# Patient Record
Sex: Female | Born: 1960 | Race: Black or African American | Hispanic: No | Marital: Married | Smoking: Never smoker
Health system: Southern US, Community
[De-identification: ages and names within clinical notes are randomized; demographics above are authoritative.]

## PROBLEM LIST (undated history)

## (undated) HISTORY — PX: DILATION AND CURETTAGE OF UTERUS: SHX78

## (undated) HISTORY — PX: BREAST BIOPSY: SHX20

---

## 1997-11-18 ENCOUNTER — Ambulatory Visit (HOSPITAL_COMMUNITY): Admission: RE | Admit: 1997-11-18 | Discharge: 1997-11-18 | Payer: Self-pay

## 2000-10-16 ENCOUNTER — Other Ambulatory Visit: Admission: RE | Admit: 2000-10-16 | Discharge: 2000-10-16 | Payer: Self-pay | Admitting: Family Medicine

## 2003-12-10 ENCOUNTER — Other Ambulatory Visit: Admission: RE | Admit: 2003-12-10 | Discharge: 2003-12-10 | Payer: Self-pay | Admitting: Family Medicine

## 2005-01-09 ENCOUNTER — Other Ambulatory Visit: Admission: RE | Admit: 2005-01-09 | Discharge: 2005-01-09 | Payer: Self-pay | Admitting: Family Medicine

## 2006-01-11 ENCOUNTER — Other Ambulatory Visit: Admission: RE | Admit: 2006-01-11 | Discharge: 2006-01-11 | Payer: Self-pay | Admitting: Family Medicine

## 2007-01-23 ENCOUNTER — Other Ambulatory Visit: Admission: RE | Admit: 2007-01-23 | Discharge: 2007-01-23 | Payer: Self-pay | Admitting: Family Medicine

## 2008-03-26 ENCOUNTER — Other Ambulatory Visit: Admission: RE | Admit: 2008-03-26 | Discharge: 2008-03-26 | Payer: Self-pay | Admitting: Family Medicine

## 2009-01-02 HISTORY — PX: BREAST BIOPSY: SHX20

## 2014-01-21 LAB — HM COLONOSCOPY

## 2015-01-14 LAB — CBC AND DIFFERENTIAL
HEMATOCRIT: 42 (ref 36–46)
HEMOGLOBIN: 13.4 (ref 12.0–16.0)
Platelets: 224 (ref 150–399)
WBC: 8.4

## 2016-01-27 ENCOUNTER — Encounter (HOSPITAL_COMMUNITY): Payer: Self-pay

## 2016-01-27 ENCOUNTER — Emergency Department (HOSPITAL_COMMUNITY): Payer: BLUE CROSS/BLUE SHIELD

## 2016-01-27 ENCOUNTER — Emergency Department (HOSPITAL_COMMUNITY)
Admission: EM | Admit: 2016-01-27 | Discharge: 2016-01-27 | Disposition: A | Discharge status: Home / Self Care | Payer: BLUE CROSS/BLUE SHIELD | Attending: Emergency Medicine | Admitting: Emergency Medicine

## 2016-01-27 DIAGNOSIS — Z79899 Other long term (current) drug therapy: Secondary | ICD-10-CM | POA: Insufficient documentation

## 2016-01-27 DIAGNOSIS — Z5181 Encounter for therapeutic drug level monitoring: Secondary | ICD-10-CM | POA: Insufficient documentation

## 2016-01-27 DIAGNOSIS — R531 Weakness: Secondary | ICD-10-CM | POA: Diagnosis not present

## 2016-01-27 DIAGNOSIS — J069 Acute upper respiratory infection, unspecified: Secondary | ICD-10-CM | POA: Diagnosis not present

## 2016-01-27 DIAGNOSIS — R930 Abnormal findings on diagnostic imaging of skull and head, not elsewhere classified: Secondary | ICD-10-CM | POA: Insufficient documentation

## 2016-01-27 DIAGNOSIS — R05 Cough: Secondary | ICD-10-CM | POA: Diagnosis present

## 2016-01-27 LAB — COMPREHENSIVE METABOLIC PANEL
ALBUMIN: 4.5 g/dL (ref 3.5–5.0)
ALK PHOS: 83 U/L (ref 38–126)
ALT: 20 U/L (ref 14–54)
ANION GAP: 14 (ref 5–15)
AST: 25 U/L (ref 15–41)
BILIRUBIN TOTAL: 0.5 mg/dL (ref 0.3–1.2)
BUN: 13 mg/dL (ref 6–20)
CALCIUM: 9.9 mg/dL (ref 8.9–10.3)
CO2: 21 mmol/L — ABNORMAL LOW (ref 22–32)
Chloride: 102 mmol/L (ref 101–111)
Creatinine, Ser: 0.92 mg/dL (ref 0.44–1.00)
GFR calc Af Amer: >60 mL/min (ref >60)
GFR calc non Af Amer: >60 mL/min (ref >60)
GLUCOSE: 140 mg/dL — AB (ref 65–99)
Potassium: 3.9 mmol/L (ref 3.5–5.1)
Sodium: 137 mmol/L (ref 135–145)
TOTAL PROTEIN: 7.5 g/dL (ref 6.5–8.1)

## 2016-01-27 LAB — URINALYSIS, ROUTINE W REFLEX MICROSCOPIC
BILIRUBIN URINE: NEGATIVE
GLUCOSE, UA: NEGATIVE mg/dL
HGB URINE DIPSTICK: NEGATIVE
Ketones, ur: >80 mg/dL — AB
Leukocytes, UA: NEGATIVE
Nitrite: NEGATIVE
PH: 6 (ref 5.0–8.0)
Protein, ur: NEGATIVE mg/dL

## 2016-01-27 LAB — I-STAT CHEM 8, ED
BUN: 15 mg/dL (ref 6–20)
CALCIUM ION: 1.11 mmol/L — AB (ref 1.15–1.40)
CHLORIDE: 102 mmol/L (ref 101–111)
CREATININE: 0.8 mg/dL (ref 0.44–1.00)
GLUCOSE: 135 mg/dL — AB (ref 65–99)
HCT: 40 % (ref 36.0–46.0)
Hemoglobin: 13.6 g/dL (ref 12.0–15.0)
Potassium: 3.7 mmol/L (ref 3.5–5.1)
Sodium: 139 mmol/L (ref 135–145)
TCO2: 23 mmol/L (ref 0–100)

## 2016-01-27 LAB — CBC
HCT: 38.2 % (ref 36.0–46.0)
HEMOGLOBIN: 12.6 g/dL (ref 12.0–15.0)
MCH: 27.9 pg (ref 26.0–34.0)
MCHC: 33 g/dL (ref 30.0–36.0)
MCV: 84.5 fL (ref 78.0–100.0)
Platelets: 212 10*3/uL (ref 150–400)
RBC: 4.52 MIL/uL (ref 3.87–5.11)
RDW: 12.2 % (ref 11.5–15.5)
WBC: 6.8 10*3/uL (ref 4.0–10.5)

## 2016-01-27 LAB — I-STAT TROPONIN, ED: Troponin i, poc: 0 ng/mL (ref 0.00–0.08)

## 2016-01-27 LAB — PROTIME-INR
INR: 1.14
Prothrombin Time: 14.7 seconds (ref 11.4–15.2)

## 2016-01-27 LAB — DIFFERENTIAL
Basophils Absolute: 0 10*3/uL (ref 0.0–0.1)
Basophils Relative: 0 %
EOS PCT: 2 %
Eosinophils Absolute: 0.1 10*3/uL (ref 0.0–0.7)
LYMPHS ABS: 2.7 10*3/uL (ref 0.7–4.0)
LYMPHS PCT: 40 %
MONO ABS: 0.6 10*3/uL (ref 0.1–1.0)
Monocytes Relative: 8 %
NEUTROS PCT: 50 %
Neutro Abs: 3.4 10*3/uL (ref 1.7–7.7)

## 2016-01-27 LAB — CBG MONITORING, ED: GLUCOSE-CAPILLARY: 123 mg/dL — AB (ref 65–99)

## 2016-01-27 LAB — APTT: aPTT: 33 seconds (ref 24–36)

## 2016-01-27 NOTE — ED Triage Notes (Signed)
Sig othe with pt concerned for stroke due to episode of disoriented and weakness when he woke.

## 2016-01-27 NOTE — ED Triage Notes (Signed)
[facility] and cough, started on z pak today, had diarrhea after use. Pt reports shaking in legs since waking up at 1150 sts decreased appetite

## 2016-01-27 NOTE — ED Provider Notes (Signed)
[facility]ENCY DEPT Provider Note   CSN: [id number] Arrival date & time: 01/27/16  0026     History   Chief Complaint Chief Complaint  Patient presents with  . Weakness  . Fatigue    HPI Nicole Hawkins is a 56 y.o. female.  Patient presents with complaint of episode of weakness and shakiness that occurred earlier tonight. She has had URI symptoms of cough and congestion for the past 3 weeks that she has been treating with OTC medications including Flonase, Delsym and tylenol. She was seen by Urgent Care yesterday and started a Z-pack. She has been eating and drinking without difficulty. No N, V, D. No fever at any time. No pain. Tonight when she got up from sleep she didn't feel she could stand because of generalized weakness and shakiness in her legs. Symptoms have resolved. No speech difficulties, syncope, chest pain, breathing difficulties.    The history is provided by the patient. No language interpreter was used.  Weakness  Pertinent negatives include no chest pain, no vomiting and no headaches.    History reviewed. No pertinent past medical history.  There are no active problems to display for this patient.   History reviewed. No pertinent surgical history.  OB History    No data available       Home Medications    Prior to Admission medications   Medication Sig Start Date End Date Taking? Authorizing Provider  azithromycin (ZITHROMAX) 250 MG tablet Take 500 mg by mouth daily.   Yes Historical Provider, MD  dextromethorphan (DELSYM) 30 MG/5ML liquid Take 30 mg by mouth as needed for cough.   Yes Historical Provider, MD    Family History History reviewed. No pertinent family history.  Social History Social History  Substance Use Topics  . Smoking status: Not on file  . Smokeless tobacco: Not on file  . Alcohol use Not on file     Allergies   Patient has no known allergies.   Review of Systems Review of Systems  Constitutional: Positive for  fatigue. Negative for chills and fever.  HENT: Positive for congestion.   Respiratory: Positive for cough.   Cardiovascular: Negative.  Negative for chest pain.  Gastrointestinal: Negative for abdominal pain, nausea and vomiting.  Genitourinary: Negative for dysuria.  Musculoskeletal: Positive for myalgias.  Skin: Negative.   Neurological: Positive for weakness. Negative for headaches.     Physical Exam Updated Vital Signs BP 133/77   Pulse 84   Temp 99 F (37.2 C) (Oral)   Resp 12   Wt 53.5 kg   SpO2 100%   Physical Exam  Constitutional: She is oriented to person, place, and time. She appears well-developed and well-nourished.  HENT:  Head: Normocephalic.  Mouth/Throat: Mucous membranes are dry.  Neck: Normal range of motion. Neck supple.  Cardiovascular: Normal rate and regular rhythm.   No murmur heard. Pulmonary/Chest: Effort normal and breath sounds normal. She has no wheezes. She has no rales.  Abdominal: Soft. Bowel sounds are normal. There is no tenderness. There is no rebound and no guarding.  Musculoskeletal: Normal range of motion.  Neurological: She is alert and oriented to person, place, and time. She exhibits normal muscle tone. Coordination normal.  Skin: Skin is warm and dry. No rash noted.  Psychiatric: She has a normal mood and affect.     ED Treatments / Results  Labs (all labs ordered are listed, but only abnormal results are displayed) Labs Reviewed  COMPREHENSIVE METABOLIC PANEL - Abnormal;  Notable for the following:       Result Value   CO2 21 (*)    Glucose, Bld 140 (*)    All other components within normal limits  URINALYSIS, ROUTINE W REFLEX MICROSCOPIC - Abnormal; Notable for the following:    Specific Gravity, Urine >1.030 (*)    Ketones, ur >80 (*)    All other components within normal limits  CBG MONITORING, ED - Abnormal; Notable for the following:    Glucose-Capillary 123 (*)    All other components within normal limits  I-STAT  CHEM 8, ED - Abnormal; Notable for the following:    Glucose, Bld 135 (*)    Calcium, Ion 1.11 (*)    All other components within normal limits  PROTIME-INR  APTT  CBC  DIFFERENTIAL  I-STAT TROPOININ, ED    EKG  EKG Interpretation  Date/Time:  Thursday January 27 2016 00:35:04 EST Ventricular Rate:  101 PR Interval:  170 QRS Duration: 94 QT Interval:  394 QTC Calculation: 510 R Axis:   69 Text Interpretation:  Sinus tachycardia Incomplete right bundle branch block Nonspecific ST abnormality Abnormal ECG No old tracing to compare Confirmed by [facility]  MD, DAVID ([id number]) on 01/27/2016 1:48:44 AM       Radiology Ct Head Wo Contrast  Result Date: 01/27/2016 CLINICAL DATA:  Initial evaluation for acute weakness, sinus disease. EXAM: CT HEAD WITHOUT CONTRAST TECHNIQUE: Contiguous axial images were obtained from the base of the skull through the vertex without intravenous contrast. COMPARISON:  None. FINDINGS: Brain: Cerebral volume normal. Gray-white matter differentiation well maintained. No evidence for acute intracranial hemorrhage. No findings to suggest acute large vessel territory infarct. No mass lesion, midline shift or mass effect. No hydrocephalus. No extra-axial fluid collection. Vascular: No hyperdense vessel. Skull: Scalp soft tissues within normal limits.  Calvarium intact. Sinuses/Orbits: Globes and orbital soft tissues within normal limits. Mild mucosal thickening within the partially visualized right maxillary sinus. Paranasal sinuses are otherwise clear. Trace left mastoid effusion. Right mastoid air cells clear. IMPRESSION: 1. Negative head CT.  No acute intracranial process identified. 2. Mild mucosal thickening within the right maxillary sinus. Otherwise clear sinuses. Electronically Signed   By: Rise Mu M.D.   On: 01/27/2016 01:04    Procedures Procedures (including critical care time)  Medications Ordered in ED Medications - No data to  display   Initial Impression / Assessment and Plan / ED Course  I have reviewed the triage vital signs and the nursing notes.  Pertinent labs & imaging results that were available during my care of the patient were reviewed by me and considered in my medical decision making (see chart for details).     Patient with recent URI symptoms x 3 weeks, today with episode of generalized weakness and shaking in her legs. Husband concerned she might have been disoriented during episode. Head CT negative. Normal neurologic exam. She has been ambulatory without imbalance or weakness. Doubt TIA or stroke. Symptoms more likely related to prolonged respiratory illness. She is felt stable for discharge home. All questions of patient and family answered. No unaddressed concerns.  Final Clinical Impressions(s) / ED Diagnoses   Final diagnoses:  None   1. Generalized weakness 2. URI  New Prescriptions New Prescriptions   No medications on file     Elpidio Anis, Cordelia Poche 01/27/16 0447    Dione Booze, MD 01/27/16 [phone]

## 2016-02-08 LAB — BASIC METABOLIC PANEL
BUN: 12 (ref 4–21)
CREATININE: 0.8 (ref 0.5–1.1)
Glucose: 76

## 2016-02-08 LAB — CBC AND DIFFERENTIAL
HCT: 39 (ref 36–46)
Hemoglobin: 12.6 (ref 12.0–16.0)
PLATELETS: 245 (ref 150–399)
WBC: 6.5

## 2016-02-10 LAB — HEMOGLOBIN A1C: HEMOGLOBIN A1C: 5.7

## 2016-02-10 LAB — BASIC METABOLIC PANEL
BUN: 12 (ref 4–21)
CREATININE: 0.7 (ref 0.5–1.1)
GLUCOSE: 85
POTASSIUM: 4 (ref 3.4–5.3)
Sodium: 138 (ref 137–147)

## 2016-02-10 LAB — HEPATIC FUNCTION PANEL
ALK PHOS: 103 (ref 25–125)
ALT: 24 (ref 7–35)
AST: 22 (ref 13–35)
Bilirubin, Total: 0.4

## 2016-02-10 LAB — LIPID PANEL
CHOLESTEROL: 165 (ref 0–200)
HDL: 41 (ref 35–70)
LDL Cholesterol: 114
Triglycerides: 59 (ref 40–160)

## 2016-02-10 LAB — VITAMIN D 25 HYDROXY (VIT D DEFICIENCY, FRACTURES): VIT D 25 HYDROXY: 40.8

## 2016-02-10 LAB — TSH: TSH: 0.96 (ref 0.41–5.90)

## 2016-03-10 DIAGNOSIS — R040 Epistaxis: Secondary | ICD-10-CM | POA: Diagnosis not present

## 2016-03-17 DIAGNOSIS — R040 Epistaxis: Secondary | ICD-10-CM | POA: Diagnosis not present

## 2016-06-19 DIAGNOSIS — J019 Acute sinusitis, unspecified: Secondary | ICD-10-CM | POA: Diagnosis not present

## 2016-11-17 DIAGNOSIS — Z23 Encounter for immunization: Secondary | ICD-10-CM | POA: Diagnosis not present

## 2017-01-12 DIAGNOSIS — Z1231 Encounter for screening mammogram for malignant neoplasm of breast: Secondary | ICD-10-CM | POA: Diagnosis not present

## 2017-06-26 ENCOUNTER — Ambulatory Visit (INDEPENDENT_AMBULATORY_CARE_PROVIDER_SITE_OTHER): Payer: Self-pay | Admitting: Family Medicine

## 2017-06-26 ENCOUNTER — Encounter: Payer: Self-pay | Admitting: Family Medicine

## 2017-06-26 VITALS — BP 155/76 | HR 80 | Ht 63.0 in | Wt 119.0 lb

## 2017-06-26 DIAGNOSIS — Z8759 Personal history of other complications of pregnancy, childbirth and the puerperium: Secondary | ICD-10-CM

## 2017-06-26 DIAGNOSIS — E559 Vitamin D deficiency, unspecified: Secondary | ICD-10-CM

## 2017-06-26 DIAGNOSIS — Z87898 Personal history of other specified conditions: Secondary | ICD-10-CM | POA: Insufficient documentation

## 2017-06-26 DIAGNOSIS — Z83438 Family history of other disorder of lipoprotein metabolism and other lipidemia: Secondary | ICD-10-CM

## 2017-06-26 DIAGNOSIS — Z803 Family history of malignant neoplasm of breast: Secondary | ICD-10-CM | POA: Insufficient documentation

## 2017-06-26 DIAGNOSIS — Z8249 Family history of ischemic heart disease and other diseases of the circulatory system: Secondary | ICD-10-CM

## 2017-06-26 DIAGNOSIS — Z818 Family history of other mental and behavioral disorders: Secondary | ICD-10-CM

## 2017-06-26 DIAGNOSIS — Z8659 Personal history of other mental and behavioral disorders: Secondary | ICD-10-CM

## 2017-06-26 DIAGNOSIS — Z833 Family history of diabetes mellitus: Secondary | ICD-10-CM

## 2017-06-26 NOTE — Progress Notes (Signed)
New patient office visit note:  Impression and Recommendations:    1. History of prediabetes   2. History of postpartum depression   3. Vitamin D deficiency   4. Family history of breast cancer-maternal grandmother and cousins   5. Family history of diabetes mellitus (DM)-first-degree relative   6. Family history of essential hypertension-in first-degree relative   7. Family history of hyperlipidemia   8. Family history of depression    1. Blood Pressure - Patient's blood pressure is normal and controlled at home.  Pt asymptomatic.  - Reviewed with the patient today that a blood pressure of 119/79 or less is ideal, with BP approaching 140/90 considered hypertension that requires medical attention.  - Encouraged patient to continue ambulatory blood pressure monitoring at home, especially if she feels concerned or symptomatic.  Do not simulate yourself prior with caffeine or emotional outbursts, and sit quietly 15-20 minutes prior to measurement.  - Continue adequate exercise, healthy diet, and monitoring sodium intake.  2. Chronic Sinusitis - Advised the patient to begin using AYR or Neilmed sinus rinses BID followed by flonase BID (one spray to each nostril). Advised that the patient may also incorporate allegra or claritin PRN.   3. BMI Counseling Explained to patient what BMI refers to, and what it means medically.    Told patient to think about it as a "medical risk stratification measurement" and how increasing BMI is associated with increasing risk/ or worsening state of various diseases such as hypertension, hyperlipidemia, diabetes, premature OA, depression etc.  American Heart Association guidelines for healthy diet, basically Mediterranean diet, and exercise guidelines of 30 minutes 5 days per week or more discussed in detail.  Health counseling performed.  All questions answered.  4. Preventative Health Maintenance - Advised patient to continue working toward  exercising to improve health.    - Patient will continue with 35 minutes of activity 5 days per week, walking 2 miles 5 days per week.  Recommended that the patient eventually strive for at least 150 minutes of moderate cardiovascular activity per week according to guidelines established by the [facility].   - Healthy dietary habits encouraged, including low-carb, and high amounts of lean protein in diet.   - Patient should also consume adequate amounts of water - half of body weight in oz of water per day.   Education and routine counseling performed. Handouts provided.  5. Follow-Up - Encouraged patient to return for follow up in 6 months for a complete physical wellness exam, with fasting blood work prior.  Patient would prefer a phone call regarding the results of her lab work.  - Patient due for gynecological exam in 2020.  - Patient knows to sign paperwork today to obtain records from previous doctors.  No orders of the defined types were placed in this encounter.   No orders of the defined types were placed in this encounter.   Gross side effects, risk and benefits, and alternatives of medications discussed with patient.  Patient is aware that all medications have potential side effects and we are unable to predict every side effect or drug-drug interaction that may occur.  Expresses verbal understanding and consents to current therapy plan and treatment regimen.  Return for Follow-up 6 months for complete physical exam, come fasting.  Please see AVS handed out to patient at the end of our visit for further patient instructions/ counseling done pertaining to today's office visit.    Note: This document was prepared  using Conservation officer, historic buildings and may include unintentional dictation errors.     This document serves as a record of services personally performed by Thomasene Lot, DO. It was created on her behalf by Peggye Fothergill, a trained medical scribe. The creation  of this record is based on the scribe's personal observations and the provider's statements to them.   I have reviewed the above medical documentation for accuracy and completeness and I concur.  Thomasene Lot 07/01/17 10:49 PM    ----------------------------------------------------------------------------------------------------------------------    Subjective:    Chief complaint:   Chief Complaint  Patient presents with  . Establish Care   HPI: Nicole Hawkins is a pleasant 57 y.o. female who presents to [facility] today to review their medical history with me and establish care.   I asked the patient to review their chronic problem list with me to ensure everything was updated and accurate.    All recent office visits with other providers, any medical records that patient brought in etc  - I reviewed today.     We asked pt to get [place] their medical records from [facility] providers/ specialists that they had seen within the past 3-5 years- if they are in private practice and/or do not work for [facility], [facility], [facility], [facility] or [facility] owned practice.  Told them to call their specialists to clarify this if they are not sure.   Patient and her husband moved here from [place] in 2017. They were formerly going back and forth, driving 3.5 hours for care right outside of [place].  Social History Patient is originally from around here.  Husband Nicole Bash "Reggie" lost his job several years ago; they moved to [place] near [place] and stayed there for 5 years.  She home schooled her son there.  Returned to this area when her son wanted to move here to go to school, [employer].   She has one child, a son age 61.  No grandcihldren yet.  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  She did attend beauty school and worked in [employer] for maybe six years.  After she got married to her husband, they moved to [place], and she didn't want to go back and get extra hours  - hence becoming a stay at home mom.  EtOH and Alcohol Use Never smoker, doesn't drink alcohol, doesn't do drugs.  Sexually active only with her husband.  Patient enjoys taking the dog for walks. Walks five days a week, 2 miles each walk.  Takes her about 35 minutes per walk. She walks a walking track.  Family History Mom was diagnosed with diabetes in her early 33's. She finally went to the doctor; "she was not an exercise person." May have developed diabetes in her 85's or 53's.  Patient states that her mom was "really overmedicated" and "overwhelmed with medication." Mother died around age 34, 55.  She isn't sure why she died.  Dad had high blood pressure, but was healthy otherwise. "He had some type of heart issue, but he would never expound on that." Passed in 2016 at age 56.  He passed in his sleep. She notes "I think he did have some type of heart issue."  Has six sisters and two brothers. One sister ([place]) has "different types of health issues." Isn't sure what these issues are, but believes these come from "[place] smell and [place]."  One sister has high cholesterol;  notes she also "dealt with a lot of depression and such."  Breast cancer in maternal grandmother; has a cousin with breast cancer.  No first degree relatives with breast cancer.  Surgical History Past Surgical History:  Procedure Laterality Date  . DILATION AND CURETTAGE OF UTERUS      Past Medical History   Patient had lab work performed at her health screen in April 2019.  Patient has never had an abnormal gynecological exam, and does not have an OBGYN. Last gynecological exam was in 2015; she believes she was checked for HPV at that time.  Patient [place] her last [place].  Patient notes that she hates taking any kind of medicine.  - Chronic Sinusitis Sometimes she experiences sinus congestion and pressure with one ear  getting clogged up.  She sleeps on that side of her face.  She uses AYR sinus rinses before going to sleep at night.  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  - Blood Pressure Patient's last health screen was performed in April 2019. Blood pressure at that time was in the 160's over 80's.  Notes that since her BP was "all over the place" at her screen, she started keeping it at home.  Notes "a few times it was really low, a few times it was really high."  She tried to take it at morning and at night before she went to bed.  At home, BP over past several months: 120's, 130's over 60's to 70's. Lowest measurements were 101/65, 109/60.   Wt Readings from Last 3 Encounters:  06/26/17 119 lb (54 kg)  01/27/16 118 lb (53.5 kg)   BP Readings from Last 3 Encounters:  06/26/17 (!) 155/76  01/27/16 140/77   Pulse Readings from Last 3 Encounters:  06/26/17 80  01/27/16 84   BMI Readings from Last 3 Encounters:  06/26/17 21.08 kg/m    Patient Care Team    Relationship Specialty Notifications Start End  Thomasene Lot, DO PCP - General Family Medicine  06/26/17     Patient Active Problem List   Diagnosis Date Noted  . Vitamin D deficiency 06/26/2017  . Family history of diabetes mellitus (DM)-first-degree relative 06/26/2017  . Family history of breast cancer 06/26/2017  . Family history of essential hypertension-in first-degree relative 06/26/2017  . Family history of hyperlipidemia 06/26/2017  . History of postpartum depression 06/26/2017  . History of prediabetes 06/26/2017  . Family history of depression 06/26/2017  . Epistaxis, recurrent 03/17/2016     History reviewed. No pertinent past medical history.   History reviewed. No pertinent past medical history.   Past Surgical History:  Procedure Laterality Date  . DILATION AND  CURETTAGE OF UTERUS       Family History  Problem Relation Age of Onset  . Diabetes Mother   . Hypertension Mother   . Stroke Mother   . Hypertension Father   . High Cholesterol Sister      Social History   Substance and Sexual Activity  Drug Use Never     Social History   Substance and Sexual Activity  Alcohol Use Never  . Frequency: Never     Social History   Tobacco Use  Smoking Status Never Smoker  Smokeless Tobacco Never Used     Current Meds  Medication Sig  . Cholecalciferol (VITAMIN D3) 1000 units CAPS Take 1 capsule by mouth daily.    Allergies: Patient has no known allergies.   Review of Systems  Constitutional: Negative for chills, diaphoresis, fever, malaise/fatigue and weight loss.  HENT: Negative for congestion, sore throat and tinnitus.   Eyes: Negative for blurred vision, double vision and photophobia.  Respiratory: Negative for cough and wheezing.   Cardiovascular: Negative for chest pain and palpitations.  Gastrointestinal: Negative for blood in stool, diarrhea, nausea and vomiting.  Genitourinary: Negative for dysuria, frequency and urgency.  Musculoskeletal: Negative for joint pain and myalgias.  Skin: Negative for itching and rash.  Neurological: Negative for dizziness, focal weakness, weakness and headaches.  Endo/Heme/Allergies: Negative for environmental allergies and polydipsia. Does not bruise/bleed easily.  Psychiatric/Behavioral: Negative for depression and memory loss. The patient is not nervous/anxious and does not have insomnia.      Objective:   Blood pressure (!) 155/76, pulse 80, height 5\' 3"  (1.6 m), weight 119 lb (54 kg), SpO2 98 %. Body mass index is 21.08 kg/m. General: Well Developed, well nourished, and in no acute distress.  Neuro: Alert and oriented x3, extra-ocular muscles intact, sensation grossly intact.  HEENT: TM bulging slightly more on left TM rather than the right.  Intact light reflex with no  effusion, otherwise normal. Gravity/AT, PERRLA, neck supple, No carotid bruits Skin: no gross rashes  Cardiac: Regular rate and rhythm Respiratory: Essentially clear to auscultation bilaterally. Not using accessory muscles, speaking in full sentences.  Abdominal: not grossly distended Musculoskeletal: Ambulates w/o diff, FROM * 4 ext.  Vasc: less 2 sec cap RF, warm and pink  Psych:  No HI/SI, judgement and insight good, Euthymic mood. Full Affect.    No results found for this or any previous visit (from the past 2160 hour(s)).

## 2017-06-26 NOTE — Patient Instructions (Signed)
Please continue to monitor your blood pressure at home with the arm blood pressure cuff.  It appears you might have whitecoat hypertension or blood pressure the gets elevated when you go to the doctor's office.  Blood pressures at home have been well controlled and please continue to monitor and bring in a log each time you come see me.  Checking it at least 2-3 times per week is appropriate.   Please realize, EXERCISE IS MEDICINE!  -  American Heart Association [facility]) guidelines for exercise : If you are in good health, without any medical conditions, you should engage in 150 minutes of moderate intensity aerobic activity per week.  This means you should be huffing and puffing throughout your workout.   Engaging in regular exercise will improve brain function and memory, as well as improve mood, boost immune system and help with weight management.  As well as the other, more well-known effects of exercise such as decreasing blood sugar levels, decreasing blood pressure,  and decreasing bad cholesterol levels/ increasing good cholesterol levels.     -  The AHA strongly endorses consumption of a diet that contains a variety of foods from all the food categories with an emphasis on fruits and vegetables; fat-free and low-fat dairy products; cereal and grain products; legumes and nuts; and fish, poultry, and/or extra lean meats.    Excessive food intake, especially of foods high in saturated and trans fats, sugar, and salt, should be avoided.    Adequate water intake of roughly 1/2 of your weight in pounds, should equal the ounces of water per day you should drink.  So for instance, if you're 200 pounds, that would be 100 ounces of water per day.         Mediterranean Diet  Why follow it? Research shows. . Those who follow the Mediterranean diet have a reduced risk of heart disease  . The diet is associated with a reduced incidence of Parkinson's and Alzheimer's diseases . People following the diet  may have longer life expectancies and lower rates of chronic diseases  . The Dietary Guidelines for Americans recommends the Mediterranean diet as an eating plan to promote health and prevent disease  What Is the Mediterranean Diet?  . Healthy eating plan based on typical foods and recipes of Mediterranean-style cooking . The diet is primarily a plant based diet; these foods should make up a majority of meals   Starches - Plant based foods should make up a majority of meals - They are an important sources of vitamins, minerals, energy, antioxidants, and fiber - Choose whole grains, foods high in fiber and minimally processed items  - Typical grain sources include wheat, oats, barley, corn, brown rice, bulgar, farro, millet, polenta, couscous  - Various types of beans include chickpeas, lentils, fava beans, black beans, white beans   Fruits  Veggies - Large quantities of antioxidant rich fruits & veggies; 6 or more servings  - Vegetables can be eaten raw or lightly drizzled with oil and cooked  - Vegetables common to the traditional Mediterranean Diet include: artichokes, arugula, beets, broccoli, brussel sprouts, cabbage, carrots, celery, collard greens, cucumbers, eggplant, kale, leeks, lemons, lettuce, mushrooms, okra, onions, peas, peppers, potatoes, pumpkin, radishes, rutabaga, shallots, spinach, sweet potatoes, turnips, zucchini - Fruits common to the Mediterranean Diet include: apples, apricots, avocados, cherries, clementines, dates, figs, grapefruits, grapes, melons, nectarines, oranges, peaches, pears, pomegranates, strawberries, tangerines  Fats - Replace butter and margarine with healthy oils, such as olive oil,  canola oil, and tahini  - Limit nuts to no more than a handful a day  - Nuts include walnuts, almonds, pecans, pistachios, pine nuts  - Limit or avoid candied, honey roasted or heavily salted nuts - Olives are central to the Mediterranean diet - can be eaten whole or used in a  variety of dishes   Meats Protein - Limiting red meat: no more than a few times a month - When eating red meat: choose lean cuts and keep the portion to the size of deck of cards - Eggs: approx. 0 to 4 times a week  - Fish and lean poultry: at least 2 a week  - Healthy protein sources include, chicken, [place], lean beef, lamb - Increase intake of seafood such as tuna, salmon, trout, mackerel, shrimp, scallops - Avoid or limit high fat processed meats such as sausage and bacon  Dairy - Include moderate amounts of low fat dairy products  - Focus on healthy dairy such as fat free yogurt, skim milk, low or reduced fat cheese - Limit dairy products higher in fat such as whole or 2% milk, cheese, ice cream  Alcohol - Moderate amounts of red wine is ok  - No more than 5 oz daily for women (all ages) and men older than age 58  - No more than 10 oz of wine daily for men younger than 7665  Other - Limit sweets and other desserts  - Use herbs and spices instead of salt to flavor foods  - Herbs and spices common to the traditional Mediterranean Diet include: basil, bay leaves, chives, cloves, cumin, fennel, garlic, lavender, marjoram, mint, oregano, parsley, pepper, rosemary, sage, savory, sumac, tarragon, thyme   It's not just a diet, it's a lifestyle:  . The Mediterranean diet includes lifestyle factors typical of those in the region  . Foods, drinks and meals are best eaten with others and savored . Daily physical activity is important for overall good health . This could be strenuous exercise like running and aerobics . This could also be more leisurely activities such as walking, housework, yard-work, or taking the stairs . Moderation is the key; a balanced and healthy diet accommodates most foods and drinks . Consider portion sizes and frequency of consumption of certain foods   Meal Ideas & Options:  . Breakfast:  o Whole wheat toast or whole wheat English muffins with peanut butter & hard  boiled egg o Steel cut oats topped with apples & cinnamon and skim milk  o Fresh fruit: banana, strawberries, melon, berries, peaches  o Smoothies: strawberries, bananas, greek yogurt, peanut butter o Low fat greek yogurt with blueberries and granola  o Egg white omelet with spinach and mushrooms o Breakfast couscous: whole wheat couscous, apricots, skim milk, cranberries  . Sandwiches:  o Hummus and grilled vegetables (peppers, zucchini, squash) on whole wheat bread   o Grilled chicken on whole wheat pita with lettuce, tomatoes, cucumbers or tzatziki  o Tuna salad on whole wheat bread: tuna salad made with greek yogurt, olives, red peppers, capers, green onions o Garlic rosemary lamb pita: lamb sauted with garlic, rosemary, salt & pepper; add lettuce, cucumber, greek yogurt to pita - flavor with lemon juice and black pepper  . Seafood:  o Mediterranean grilled salmon, seasoned with garlic, basil, parsley, lemon juice and black pepper o Shrimp, lemon, and spinach whole-grain pasta salad made with low fat greek yogurt  o Seared scallops with lemon orzo  o Seared tuna steaks seasoned  salt, pepper, coriander topped with tomato mixture of olives, tomatoes, olive oil, minced garlic, parsley, green onions and cappers  . Meats:  o Herbed greek chicken salad with kalamata olives, cucumber, feta  o Red bell peppers stuffed with spinach, bulgur, lean ground beef (or lentils) & topped with feta   o Kebabs: skewers of chicken, tomatoes, onions, zucchini, squash  o [place] burgers: made with red onions, mint, dill, lemon juice, feta cheese topped with roasted red peppers . Vegetarian o Cucumber salad: cucumbers, artichoke hearts, celery, red onion, feta cheese, tossed in olive oil & lemon juice  o Hummus and whole grain pita points with a greek salad (lettuce, tomato, feta, olives, cucumbers, red onion) o Lentil soup with celery, carrots made with vegetable broth, garlic, salt and pepper  o Tabouli  salad: parsley, bulgur, mint, scallions, cucumbers, tomato, radishes, lemon juice, olive oil, salt and pepper.

## 2017-10-26 ENCOUNTER — Ambulatory Visit (INDEPENDENT_AMBULATORY_CARE_PROVIDER_SITE_OTHER): Payer: PRIVATE HEALTH INSURANCE

## 2017-10-26 VITALS — BP 138/77 | HR 69

## 2017-10-26 DIAGNOSIS — Z23 Encounter for immunization: Secondary | ICD-10-CM | POA: Diagnosis not present

## 2017-10-26 NOTE — Progress Notes (Signed)
Pt here for influenza vaccine.  Screening questionnaire reviewed, VIS provided to patient, and any/all patient questions answered.  T. Nelson, CMA  

## 2017-12-17 ENCOUNTER — Ambulatory Visit (INDEPENDENT_AMBULATORY_CARE_PROVIDER_SITE_OTHER): Payer: PRIVATE HEALTH INSURANCE | Admitting: Family Medicine

## 2017-12-17 ENCOUNTER — Encounter: Payer: Self-pay | Admitting: Family Medicine

## 2017-12-17 ENCOUNTER — Other Ambulatory Visit (HOSPITAL_COMMUNITY)
Admission: RE | Admit: 2017-12-17 | Discharge: 2017-12-17 | Disposition: A | Discharge status: Home / Self Care | Payer: PRIVATE HEALTH INSURANCE | Source: Ambulatory Visit | Attending: Family Medicine | Admitting: Family Medicine

## 2017-12-17 VITALS — BP 130/86 | HR 80 | Temp 97.6°F | Ht 63.0 in | Wt 120.5 lb

## 2017-12-17 DIAGNOSIS — Z87898 Personal history of other specified conditions: Secondary | ICD-10-CM

## 2017-12-17 DIAGNOSIS — Z124 Encounter for screening for malignant neoplasm of cervix: Secondary | ICD-10-CM | POA: Diagnosis present

## 2017-12-17 DIAGNOSIS — Z0000 Encounter for general adult medical examination without abnormal findings: Secondary | ICD-10-CM

## 2017-12-17 DIAGNOSIS — Z1239 Encounter for other screening for malignant neoplasm of breast: Secondary | ICD-10-CM

## 2017-12-17 DIAGNOSIS — E559 Vitamin D deficiency, unspecified: Secondary | ICD-10-CM

## 2017-12-17 DIAGNOSIS — Z0100 Encounter for examination of eyes and vision without abnormal findings: Secondary | ICD-10-CM | POA: Diagnosis not present

## 2017-12-17 DIAGNOSIS — Z1211 Encounter for screening for malignant neoplasm of colon: Secondary | ICD-10-CM

## 2017-12-17 DIAGNOSIS — Z23 Encounter for immunization: Secondary | ICD-10-CM | POA: Diagnosis not present

## 2017-12-17 DIAGNOSIS — Z113 Encounter for screening for infections with a predominantly sexual mode of transmission: Secondary | ICD-10-CM

## 2017-12-17 DIAGNOSIS — Z803 Family history of malignant neoplasm of breast: Secondary | ICD-10-CM

## 2017-12-17 DIAGNOSIS — Z719 Counseling, unspecified: Secondary | ICD-10-CM | POA: Insufficient documentation

## 2017-12-17 LAB — POC HEMOCCULT BLD/STL (OFFICE/1-CARD/DIAGNOSTIC): Fecal Occult Blood, POC: NEGATIVE

## 2017-12-17 MED ORDER — TETANUS-DIPHTH-ACELL PERTUSSIS 5-2.5-18.5 LF-MCG/0.5 IM SUSP
0.5000 mL | Freq: Once | INTRAMUSCULAR | Status: AC
  Administered 2017-12-17: 0.5 mL via INTRAMUSCULAR

## 2017-12-17 NOTE — Patient Instructions (Addendum)
Pt needs a photo please!   Patient prefers to come in in near future to review labs in person.  Please make appointment  -Please let us know what your insurance says about the Shingrix vaccine    Preventive Care for Adults, Female  A healthy lifestyle and preventive care can promote health and wellness. Preventive health guidelines for women include the following key practices.   A routine yearly physical is a good way to check with your health care provider about your health and preventive screening. It is a chance to share any concerns and updates on your health and to receive a thorough exam.   Visit your dentist for a routine exam and preventive care every 6 months. Brush your teeth twice a day and floss once a day. Good oral hygiene prevents tooth decay and gum disease.   The frequency of eye exams is based on your age, health, family medical history, use of contact lenses, and other factors. Follow your health care provider's recommendations for frequency of eye exams.   Eat a healthy diet. Foods like vegetables, fruits, whole grains, low-fat dairy products, and lean protein foods contain the nutrients you need without too many calories. Decrease your intake of foods high in solid fats, added sugars, and salt. Eat the right amount of calories for you.Get information about a proper diet from your health care provider, if necessary.   Regular physical exercise is one of the most important things you can do for your health. Most adults should get at least 150 minutes of moderate-intensity exercise (any activity that increases your heart rate and causes you to sweat) each week. In addition, most adults need muscle-strengthening exercises on 2 or more days a week.   Maintain a healthy weight. The body mass index (BMI) is a screening tool to identify possible weight problems. It provides an estimate of body fat based on height and weight. Your health care provider can find your BMI, and  can help you achieve or maintain a healthy weight.For adults 20 years and older:   - A BMI below 18.5 is considered underweight.   - A BMI of 18.5 to 24.9 is normal.   - A BMI of 25 to 29.9 is considered overweight.   - A BMI of 30 and above is considered obese.   Maintain normal blood lipids and cholesterol levels by exercising and minimizing your intake of trans and saturated fats.  Eat a balanced diet with plenty of fruit and vegetables. Blood tests for lipids and cholesterol should begin at age 65 and be repeated every 5 years minimum.  If your lipid or cholesterol levels are high, you are over 40, or you are at high risk for heart disease, you may need your cholesterol levels checked more frequently.Ongoing high lipid and cholesterol levels should be treated with medicines if diet and exercise are not working.   If you smoke, find out from your health care provider how to quit. If you do not use tobacco, do not start.   Lung cancer screening is recommended for adults aged 66-80 years who are at high risk for developing lung cancer because of a history of smoking. A yearly low-dose CT scan of the lungs is recommended for people who have at least a 30-pack-year history of smoking and are a current smoker or have quit within the past 15 years. A pack year of smoking is smoking an average of 1 pack of cigarettes a day for 1 year (for example:  1 pack a day for 30 years or 2 packs a day for 15 years). Yearly screening should continue until the smoker has stopped smoking for at least 15 years. Yearly screening should be stopped for people who develop a health problem that would prevent them from having lung cancer treatment.   If you are pregnant, do not drink alcohol. If you are breastfeeding, be very cautious about drinking alcohol. If you are not pregnant and choose to drink alcohol, do not have more than 1 drink per day. One drink is considered to be 12 ounces (355 mL) of beer, 5 ounces (148 mL)  of wine, or 1.5 ounces (44 mL) of liquor.   Avoid use of street drugs. Do not share needles with anyone. Ask for help if you need support or instructions about stopping the use of drugs.   High blood pressure causes heart disease and increases the risk of stroke. Your blood pressure should be checked at least yearly.  Ongoing high blood pressure should be treated with medicines if weight loss and exercise do not work.   If you are 66-21 years old, ask your health care provider if you should take aspirin to prevent strokes.   Diabetes screening involves taking a blood sample to check your fasting blood sugar level. This should be done once every 3 years, after age 40, if you are within normal weight and without risk factors for diabetes. Testing should be considered at a younger age or be carried out more frequently if you are overweight and have at least 1 risk factor for diabetes.   Breast cancer screening is essential preventive care for women. You should practice "breast self-awareness."  This means understanding the normal appearance and feel of your breasts and may include breast self-examination.  Any changes detected, no matter how small, should be reported to a health care provider.  Women in their 48s and 30s should have a clinical breast exam (CBE) by a health care provider as part of a regular health exam every 1 to 3 years.  After age 15, women should have a CBE every year.  Starting at age 43, women should consider having a mammogram (breast X-ray test) every year.  Women who have a family history of breast cancer should talk to their health care provider about genetic screening.  Women at a high risk of breast cancer should talk to their health care providers about having an MRI and a mammogram every year.   -Breast cancer gene (BRCA)-related cancer risk assessment is recommended for women who have family members with BRCA-related cancers. BRCA-related cancers include breast, ovarian,  tubal, and peritoneal cancers. Having family members with these cancers may be associated with an increased risk for harmful changes (mutations) in the breast cancer genes BRCA1 and BRCA2. Results of the assessment will determine the need for genetic counseling and BRCA1 and BRCA2 testing.   The Pap test is a screening test for cervical cancer. A Pap test can show cell changes on the cervix that might become cervical cancer if left untreated. A Pap test is a procedure in which cells are obtained and examined from the lower end of the uterus (cervix).   - Women should have a Pap test starting at age 79.   - Between ages 5 and 36, Pap tests should be repeated every 2 years.   - Beginning at age 75, you should have a Pap test every 3 years as long as the past 3 Pap tests have  been normal.   - Some women have medical problems that increase the chance of getting cervical cancer. Talk to your health care provider about these problems. It is especially important to talk to your health care provider if a new problem develops soon after your last Pap test. In these cases, your health care provider may recommend more frequent screening and Pap tests.   - The above recommendations are the same for women who have or have not gotten the vaccine for human papillomavirus (HPV).   - If you had a hysterectomy for a problem that was not cancer or a condition that could lead to cancer, then you no longer need Pap tests. Even if you no longer need a Pap test, a regular exam is a good idea to make sure no other problems are starting.   - If you are between ages 81 and 57 years, and you have had normal Pap tests going back 10 years, you no longer need Pap tests. Even if you no longer need a Pap test, a regular exam is a good idea to make sure no other problems are starting.   - If you have had past treatment for cervical cancer or a condition that could lead to cancer, you need Pap tests and screening for cancer for  at least 20 years after your treatment.   - If Pap tests have been discontinued, risk factors (such as a new sexual partner) need to be reassessed to determine if screening should be resumed.   - The HPV test is an additional test that may be used for cervical cancer screening. The HPV test looks for the virus that can cause the cell changes on the cervix. The cells collected during the Pap test can be tested for HPV. The HPV test could be used to screen women aged 85 years and older, and should be used in women of any age who have unclear Pap test results. After the age of 23, women should have HPV testing at the same frequency as a Pap test.   Colorectal cancer can be detected and often prevented. Most routine colorectal cancer screening begins at the age of 23 years and continues through age 14 years. However, your health care provider may recommend screening at an earlier age if you have risk factors for colon cancer. On a yearly basis, your health care provider may provide home test kits to check for hidden blood in the stool.  Use of a small camera at the end of a tube, to directly examine the colon (sigmoidoscopy or colonoscopy), can detect the earliest forms of colorectal cancer. Talk to your health care provider about this at age 89, when routine screening begins. Direct exam of the colon should be repeated every 5 -10 years through age 109 years, unless early forms of pre-cancerous polyps or small growths are found.   People who are at an increased risk for hepatitis B should be screened for this virus. You are considered at high risk for hepatitis B if:  -You were born in a country where hepatitis B occurs often. Talk with your health care provider about which countries are considered high risk.  - Your parents were born in a high-risk country and you have not received a shot to protect against hepatitis B (hepatitis B vaccine).  - You have HIV or AIDS.  - You use needles to inject street  drugs.  - You live with, or have sex with, someone who has Hepatitis B.  -  You get hemodialysis treatment.  - You take certain medicines for conditions like cancer, organ transplantation, and autoimmune conditions.   Hepatitis C blood testing is recommended for all people born from [birth year] through 1966 and any individual with known risks for hepatitis C.   Practice safe sex. Use condoms and avoid high-risk sexual practices to reduce the spread of sexually transmitted infections (STIs). STIs include gonorrhea, chlamydia, syphilis, trichomonas, herpes, HPV, and human immunodeficiency virus (HIV). Herpes, HIV, and HPV are viral illnesses that have no cure. They can result in disability, cancer, and death. Sexually active women aged 14 years and younger should be checked for chlamydia. Older women with new or multiple partners should also be tested for chlamydia. Testing for other STIs is recommended if you are sexually active and at increased risk.   Osteoporosis is a disease in which the bones lose minerals and strength with aging. This can result in serious bone fractures or breaks. The risk of osteoporosis can be identified using a bone density scan. Women ages 59 years and over and women at risk for fractures or osteoporosis should discuss screening with their health care providers. Ask your health care provider whether you should take a calcium supplement or vitamin D to There are also several preventive steps women can take to avoid osteoporosis and resulting fractures or to keep osteoporosis from worsening. -->Recommendations include:  Eat a balanced diet high in fruits, vegetables, calcium, and vitamins.  Get enough calcium. The recommended total intake of is 1,200 mg daily; for best absorption, if taking supplements, divide doses into 250-500 mg doses throughout the day. Of the two types of calcium, calcium carbonate is best absorbed when taken with food but calcium citrate can be taken on an  empty stomach.  Get enough vitamin D. NAMS and the [employer] recommend at least 1,000 IU per day for women age 26 and over who are at risk of vitamin D deficiency. Vitamin D deficiency can be caused by inadequate sun exposure (for example, those who live in [place]).  Avoid alcohol and smoking. Heavy alcohol intake (more than 7 drinks per week) increases the risk of falls and hip fracture and women smokers tend to lose bone more rapidly and have lower bone mass than nonsmokers. Stopping smoking is one of the most important changes women can make to improve their health and decrease risk for disease.  Be physically active every day. Weight-bearing exercise (for example, fast walking, hiking, jogging, and weight training) may strengthen bones or slow the rate of bone loss that comes with aging. Balancing and muscle-strengthening exercises can reduce the risk of falling and fracture.  Consider therapeutic medications. Currently, several types of effective drugs are available. Healthcare providers can recommend the type most appropriate for each woman.  Eliminate environmental factors that may contribute to accidents. Falls cause nearly 90% of all osteoporotic fractures, so reducing this risk is an important bone-health strategy. Measures include ample lighting, removing obstructions to walking, using nonskid rugs on floors, and placing mats and/or grab bars in showers.  Be aware of medication side effects. Some common medicines make bones weaker. These include a type of steroid drug called glucocorticoids used for arthritis and asthma, some antiseizure drugs, certain sleeping pills, treatments for endometriosis, and some cancer drugs. An overactive thyroid gland or using too much thyroid hormone for an underactive thyroid can also be a problem. If you are taking these medicines, talk to your doctor about what you can do to help  protect your bones.reduce the rate of  osteoporosis.    Menopause can be associated with physical symptoms and risks. Hormone replacement therapy is available to decrease symptoms and risks. You should talk to your health care provider about whether hormone replacement therapy is right for you.   Use sunscreen. Apply sunscreen liberally and repeatedly throughout the day. You should seek shade when your shadow is shorter than you. Protect yourself by wearing long sleeves, pants, a wide-brimmed hat, and sunglasses year round, whenever you are outdoors.   Once a month, do a whole body skin exam, using a mirror to look at the skin on your back. Tell your health care provider of new moles, moles that have irregular borders, moles that are larger than a pencil eraser, or moles that have changed in shape or color.   -Stay current with required vaccines (immunizations).   Influenza vaccine. All adults should be immunized every year.  Tetanus, diphtheria, and acellular pertussis (Td, Tdap) vaccine. Pregnant women should receive 1 dose of Tdap vaccine during each pregnancy. The dose should be obtained regardless of the length of time since the last dose. Immunization is preferred during the 27th 36th week of gestation. An adult who has not previously received Tdap or who does not know her vaccine status should receive 1 dose of Tdap. This initial dose should be followed by tetanus and diphtheria toxoids (Td) booster doses every 10 years. Adults with an unknown or incomplete history of completing a 3-dose immunization series with Td-containing vaccines should begin or complete a primary immunization series including a Tdap dose. Adults should receive a Td booster every 10 years.  Varicella vaccine. An adult without evidence of immunity to varicella should receive 2 doses or a second dose if she has previously received 1 dose. Pregnant females who do not have evidence of immunity should receive the first dose after pregnancy. This first dose  should be obtained before leaving the health care facility. The second dose should be obtained 4 8 weeks after the first dose.  Human papillomavirus (HPV) vaccine. Females aged 18 26 years who have not received the vaccine previously should obtain the 3-dose series. The vaccine is not recommended for use in pregnant females. However, pregnancy testing is not needed before receiving a dose. If a female is found to be pregnant after receiving a dose, no treatment is needed. In that case, the remaining doses should be delayed until after the pregnancy. Immunization is recommended for any person with an immunocompromised condition through the age of 63 years if she did not get any or all doses earlier. During the 3-dose series, the second dose should be obtained 4 8 weeks after the first dose. The third dose should be obtained 24 weeks after the first dose and 16 weeks after the second dose.  Zoster vaccine. One dose is recommended for adults aged 67 years or older unless certain conditions are present.  Measles, mumps, and rubella (MMR) vaccine. Adults born before [birth year] generally are considered immune to measles and mumps. Adults born in 2 or later should have 1 or more doses of MMR vaccine unless there is a contraindication to the vaccine or there is laboratory evidence of immunity to each of the three diseases. A routine second dose of MMR vaccine should be obtained at least 28 days after the first dose for students attending postsecondary schools, health care workers, or international travelers. People who received inactivated measles vaccine or an unknown type of measles vaccine during 1963  1967 should receive 2 doses of MMR vaccine. People who received inactivated mumps vaccine or an unknown type of mumps vaccine before 1979 and are at high risk for mumps infection should consider immunization with 2 doses of MMR vaccine. For females of childbearing age, rubella immunity should be determined. If there is  no evidence of immunity, females who are not pregnant should be vaccinated. If there is no evidence of immunity, females who are pregnant should delay immunization until after pregnancy. Unvaccinated health care workers born before [birth year] who lack laboratory evidence of measles, mumps, or rubella immunity or laboratory confirmation of disease should consider measles and mumps immunization with 2 doses of MMR vaccine or rubella immunization with 1 dose of MMR vaccine.  Pneumococcal 13-valent conjugate (PCV13) vaccine. When indicated, a person who is uncertain of her immunization history and has no record of immunization should receive the PCV13 vaccine. An adult aged 54 years or older who has certain medical conditions and has not been previously immunized should receive 1 dose of PCV13 vaccine. This PCV13 should be followed with a dose of pneumococcal polysaccharide (PPSV23) vaccine. The PPSV23 vaccine dose should be obtained at least 8 weeks after the dose of PCV13 vaccine. An adult aged 43 years or older who has certain medical conditions and previously received 1 or more doses of PPSV23 vaccine should receive 1 dose of PCV13. The PCV13 vaccine dose should be obtained 1 or more years after the last PPSV23 vaccine dose.  Pneumococcal polysaccharide (PPSV23) vaccine. When PCV13 is also indicated, PCV13 should be obtained first. All adults aged 67 years and older should be immunized. An adult younger than age 66 years who has certain medical conditions should be immunized. Any person who resides in a nursing home or long-term care facility should be immunized. An adult smoker should be immunized. People with an immunocompromised condition and certain other conditions should receive both PCV13 and PPSV23 vaccines. People with human immunodeficiency virus (HIV) infection should be immunized as soon as possible after diagnosis. Immunization during chemotherapy or radiation therapy should be avoided. Routine use of  PPSV23 vaccine is not recommended for American Indians, [place] Natives, or people younger than 65 years unless there are medical conditions that require PPSV23 vaccine. When indicated, people who have unknown immunization and have no record of immunization should receive PPSV23 vaccine. One-time revaccination 5 years after the first dose of PPSV23 is recommended for people aged 73 64 years who have chronic kidney failure, nephrotic syndrome, asplenia, or immunocompromised conditions. People who received 1 2 doses of PPSV23 before age 37 years should receive another dose of PPSV23 vaccine at age 75 years or later if at least 5 years have passed since the previous dose. Doses of PPSV23 are not needed for people immunized with PPSV23 at or after age 18 years.  Meningococcal vaccine. Adults with asplenia or persistent complement component deficiencies should receive 2 doses of quadrivalent meningococcal conjugate (MenACWY-D) vaccine. The doses should be obtained at least 2 months apart. Microbiologists working with certain meningococcal bacteria, [place] recruits, people at risk during an outbreak, and people who travel to or live in countries with a high rate of meningitis should be immunized. A first-year college student up through age 10 years who is living in a residence hall should receive a dose if she did not receive a dose on or after her 16th birthday. Adults who have certain high-risk conditions should receive one or more doses of vaccine.  Hepatitis A vaccine. Adults who  wish to be protected from this disease, have certain high-risk conditions, work with hepatitis A-infected animals, work in hepatitis A research labs, or travel to or work in countries with a high rate of hepatitis A should be immunized. Adults who were previously unvaccinated and who anticipate close contact with an international adoptee during the first 60 days after arrival in the [place] from a country with a high rate of  hepatitis A should be immunized.  Hepatitis B vaccine.  Adults who wish to be protected from this disease, have certain high-risk conditions, may be exposed to blood or other infectious body fluids, are household contacts or sex partners of hepatitis B positive people, are clients or workers in certain care facilities, or travel to or work in countries with a high rate of hepatitis B should be immunized.  Haemophilus influenzae type b (Hib) vaccine. A previously unvaccinated person with asplenia or sickle cell disease or having a scheduled splenectomy should receive 1 dose of Hib vaccine. Regardless of previous immunization, a recipient of a hematopoietic stem cell transplant should receive a 3-dose series 6 12 months after her successful transplant. Hib vaccine is not recommended for adults with HIV infection.  Preventive Services / Frequency Ages 35 to 39years  Blood pressure check.** / Every 1 to 2 years.  Lipid and cholesterol check.** / Every 5 years beginning at age 35.  Clinical breast exam.** / Every 3 years for women in their 22s and 61s.  BRCA-related cancer risk assessment.** / For women who have family members with a BRCA-related cancer (breast, ovarian, tubal, or peritoneal cancers).  Pap test.** / Every 2 years from ages 34 through 58. Every 3 years starting at age 50 through age 49 or 57 with a history of 3 consecutive normal Pap tests.  HPV screening.** / Every 3 years from ages 65 through ages 12 to 12 with a history of 3 consecutive normal Pap tests.  Hepatitis C blood test.** / For any individual with known risks for hepatitis C.  Skin self-exam. / Monthly.  Influenza vaccine. / Every year.  Tetanus, diphtheria, and acellular pertussis (Tdap, Td) vaccine.** / Consult your health care provider. Pregnant women should receive 1 dose of Tdap vaccine during each pregnancy. 1 dose of Td every 10 years.  Varicella vaccine.** / Consult your health care provider. Pregnant  females who do not have evidence of immunity should receive the first dose after pregnancy.  HPV vaccine. / 3 doses over 6 months, if 63 and younger. The vaccine is not recommended for use in pregnant females. However, pregnancy testing is not needed before receiving a dose.  Measles, mumps, rubella (MMR) vaccine.** / You need at least 1 dose of MMR if you were born in 1957 or later. You may also need a 2nd dose. For females of childbearing age, rubella immunity should be determined. If there is no evidence of immunity, females who are not pregnant should be vaccinated. If there is no evidence of immunity, females who are pregnant should delay immunization until after pregnancy.  Pneumococcal 13-valent conjugate (PCV13) vaccine.** / Consult your health care provider.  Pneumococcal polysaccharide (PPSV23) vaccine.** / 1 to 2 doses if you smoke cigarettes or if you have certain conditions.  Meningococcal vaccine.** / 1 dose if you are age 82 to 45 years and a Market researcher living in a residence hall, or have one of several medical conditions, you need to get vaccinated against meningococcal disease. You may also need additional booster doses.  Hepatitis A vaccine.** / Consult your health care provider.  Hepatitis B vaccine.** / Consult your health care provider.  Haemophilus influenzae type b (Hib) vaccine.** / Consult your health care provider.  Ages 33 to 64years  Blood pressure check.** / Every 1 to 2 years.  Lipid and cholesterol check.** / Every 5 years beginning at age 27 years.  Lung cancer screening. / Every year if you are aged 27 80 years and have a 30-pack-year history of smoking and currently smoke or have quit within the past 15 years. Yearly screening is stopped once you have quit smoking for at least 15 years or develop a health problem that would prevent you from having lung cancer treatment.  Clinical breast exam.** / Every year after age 49  years.  BRCA-related cancer risk assessment.** / For women who have family members with a BRCA-related cancer (breast, ovarian, tubal, or peritoneal cancers).  Mammogram.** / Every year beginning at age 45 years and continuing for as long as you are in good health. Consult with your health care provider.  Pap test.** / Every 3 years starting at age 42 years through age 27 or 37 years with a history of 3 consecutive normal Pap tests.  HPV screening.** / Every 3 years from ages 46 years through ages 79 to 17 years with a history of 3 consecutive normal Pap tests.  Fecal occult blood test (FOBT) of stool. / Every year beginning at age 64 years and continuing until age 1 years. You may not need to do this test if you get a colonoscopy every 10 years.  Flexible sigmoidoscopy or colonoscopy.** / Every 5 years for a flexible sigmoidoscopy or every 10 years for a colonoscopy beginning at age 80 years and continuing until age 8 years.  Hepatitis C blood test.** / For all people born from [birth year] through 1966 and any individual with known risks for hepatitis C.  Skin self-exam. / Monthly.  Influenza vaccine. / Every year.  Tetanus, diphtheria, and acellular pertussis (Tdap/Td) vaccine.** / Consult your health care provider. Pregnant women should receive 1 dose of Tdap vaccine during each pregnancy. 1 dose of Td every 10 years.  Varicella vaccine.** / Consult your health care provider. Pregnant females who do not have evidence of immunity should receive the first dose after pregnancy.  Zoster vaccine.** / 1 dose for adults aged 9 years or older.  Measles, mumps, rubella (MMR) vaccine.** / You need at least 1 dose of MMR if you were born in 1957 or later. You may also need a 2nd dose. For females of childbearing age, rubella immunity should be determined. If there is no evidence of immunity, females who are not pregnant should be vaccinated. If there is no evidence of immunity, females who are  pregnant should delay immunization until after pregnancy.  Pneumococcal 13-valent conjugate (PCV13) vaccine.** / Consult your health care provider.  Pneumococcal polysaccharide (PPSV23) vaccine.** / 1 to 2 doses if you smoke cigarettes or if you have certain conditions.  Meningococcal vaccine.** / Consult your health care provider.  Hepatitis A vaccine.** / Consult your health care provider.  Hepatitis B vaccine.** / Consult your health care provider.  Haemophilus influenzae type b (Hib) vaccine.** / Consult your health care provider.  Ages 70 years and over  Blood pressure check.** / Every 1 to 2 years.  Lipid and cholesterol check.** / Every 5 years beginning at age 37 years.  Lung cancer screening. / Every year if you are aged 6 80 years  and have a 30-pack-year history of smoking and currently smoke or have quit within the past 15 years. Yearly screening is stopped once you have quit smoking for at least 15 years or develop a health problem that would prevent you from having lung cancer treatment.  Clinical breast exam.** / Every year after age 31 years.  BRCA-related cancer risk assessment.** / For women who have family members with a BRCA-related cancer (breast, ovarian, tubal, or peritoneal cancers).  Mammogram.** / Every year beginning at age 4 years and continuing for as long as you are in good health. Consult with your health care provider.  Pap test.** / Every 3 years starting at age 83 years through age 71 or 55 years with 3 consecutive normal Pap tests. Testing can be stopped between 65 and 70 years with 3 consecutive normal Pap tests and no abnormal Pap or HPV tests in the past 10 years.  HPV screening.** / Every 3 years from ages 84 years through ages 35 or 65 years with a history of 3 consecutive normal Pap tests. Testing can be stopped between 65 and 70 years with 3 consecutive normal Pap tests and no abnormal Pap or HPV tests in the past 10 years.  Fecal occult  blood test (FOBT) of stool. / Every year beginning at age 72 years and continuing until age 71 years. You may not need to do this test if you get a colonoscopy every 10 years.  Flexible sigmoidoscopy or colonoscopy.** / Every 5 years for a flexible sigmoidoscopy or every 10 years for a colonoscopy beginning at age 8 years and continuing until age 78 years.  Hepatitis C blood test.** / For all people born from [birth year] through 1966 and any individual with known risks for hepatitis C.  Osteoporosis screening.** / A one-time screening for women ages 29 years and over and women at risk for fractures or osteoporosis.  Skin self-exam. / Monthly.  Influenza vaccine. / Every year.  Tetanus, diphtheria, and acellular pertussis (Tdap/Td) vaccine.** / 1 dose of Td every 10 years.  Varicella vaccine.** / Consult your health care provider.  Zoster vaccine.** / 1 dose for adults aged 54 years or older.  Pneumococcal 13-valent conjugate (PCV13) vaccine.** / Consult your health care provider.  Pneumococcal polysaccharide (PPSV23) vaccine.** / 1 dose for all adults aged 65 years and older.  Meningococcal vaccine.** / Consult your health care provider.  Hepatitis A vaccine.** / Consult your health care provider.  Hepatitis B vaccine.** / Consult your health care provider.  Haemophilus influenzae type b (Hib) vaccine.** / Consult your health care provider. ** Family history and personal history of risk and conditions may change your health care provider's recommendations. Document Released: 02/14/2001 Document Revised: 10/09/2012  [facility] Patient Information 2014 [place], [place].   EXERCISE AND DIET:  We recommended that you start or continue a regular exercise program for good health. Regular exercise means any activity that makes your heart beat faster and makes you sweat.  We recommend exercising at least 30 minutes per day at least 3 days a week, preferably 5.  We also recommend a diet low in fat  and sugar / carbohydrates.  Inactivity, poor dietary choices and obesity can cause diabetes, heart attack, stroke, and kidney damage, among others.     ALCOHOL AND SMOKING:  Women should limit their alcohol intake to no more than 7 drinks/beers/glasses of wine (combined, not each!) per week. Moderation of alcohol intake to this level decreases your risk of breast cancer and  liver damage.  ( And of course, no recreational drugs are part of a healthy lifestyle.)  Also, you should not be smoking at all or even being exposed to second hand smoke. Most people know smoking can cause cancer, and various heart and lung diseases, but did you know it also contributes to weakening of your bones?  Aging of your skin?  Yellowing of your teeth and nails?   CALCIUM AND VITAMIN D:  Adequate intake of calcium and Vitamin D are recommended.  The recommendations for exact amounts of these supplements seem to change often, but generally speaking 600 mg of calcium (either carbonate or citrate) and 800 units of Vitamin D per day seems prudent. Certain women may benefit from higher intake of Vitamin D.  If you are among these women, your doctor will have told you during your visit.     PAP SMEARS:  Pap smears, to check for cervical cancer or precancers,  have traditionally been done yearly, although recent scientific advances have shown that most women can have pap smears less often.  However, every woman still should have a physical exam from her gynecologist or primary care physician every year. It will include a breast check, inspection of the vulva and vagina to check for abnormal growths or skin changes, a visual exam of the cervix, and then an exam to evaluate the size and shape of the uterus and ovaries.  And after 58 years of age, a rectal exam is indicated to check for rectal cancers. We will also provide age appropriate advice regarding health maintenance, like when you should have certain vaccines, screening for  sexually transmitted diseases, bone density testing, colonoscopy, mammograms, etc.    MAMMOGRAMS:  All women over 84 years old should have a yearly mammogram. Many facilities now offer a "3D" mammogram, which may cost around $50 extra out of pocket. If possible,  we recommend you accept the option to have the 3D mammogram performed.  It both reduces the number of women who will be called back for extra views which then turn out to be normal, and it is better than the routine mammogram at detecting truly abnormal areas.     COLONOSCOPY:  Colonoscopy to screen for colon cancer is recommended for all women at age 35.  We know, you hate the idea of the prep.  We agree, BUT, having colon cancer and not knowing it is worse!!  Colon cancer so often starts as a polyp that can be seen and removed at colonscopy, which can quite literally save your life!  And if your first colonoscopy is normal and you have no family history of colon cancer, most women don't have to have it again for 10 years.  Once every ten years, you can do something that may end up saving your life, right?  We will be happy to help you get it scheduled when you are ready.  Be sure to check your insurance coverage so you understand how much it will cost.  It may be covered as a preventative service at no cost, but you should check your particular policy.    DTaP Vaccine (Diphtheria, Tetanus, and Pertussis): What You Need to Know 1. Why get vaccinated? Diphtheria, tetanus, and pertussis are serious diseases caused by bacteria. Diphtheria and pertussis are spread from person to person. Tetanus enters the body through cuts or wounds. DIPHTHERIA causes a thick covering in the back of the throat.  It can lead to breathing problems, paralysis, heart failure,  and even death.  TETANUS (Lockjaw) causes painful tightening of the muscles, usually all over the body.  It can lead to "locking" of the jaw so the victim cannot open his mouth or swallow.  Tetanus leads to death in up to 2 out of 10 cases.  PERTUSSIS (Whooping Cough) causes coughing spells so bad that it is hard for infants to eat, drink, or breathe. These spells can last for weeks.  It can lead to pneumonia, seizures (jerking and staring spells), brain damage, and death.  Diphtheria, tetanus, and pertussis vaccine (DTaP) can help prevent these diseases. Most children who are vaccinated with DTaP will be protected throughout childhood. Many more children would get these diseases if we stopped vaccinating. DTaP is a safer version of an older vaccine called DTP. DTP is no longer used in the [place]. 2. Who should get DTaP vaccine and when? Children should get 5 doses of DTaP vaccine, one dose at each of the following ages:  2 months  4 months  6 months  15-18 months  4-6 years  DTaP may be given at the same time as other vaccines. 3. Some children should not get DTaP vaccine or should wait  Children with minor illnesses, such as a cold, may be vaccinated. But children who are moderately or severely ill should usually wait until they recover before getting DTaP vaccine.  Any child who had a life-threatening allergic reaction after a dose of DTaP should not get another dose.  Any child who suffered a brain or nervous system disease within 7 days after a dose of DTaP should not get another dose.  Talk with your doctor if your child: ? had a seizure or collapsed after a dose of DTaP, ? cried non-stop for 3 hours or more after a dose of DTaP, ? had a fever over 105F after a dose of DTaP. Ask your doctor for more information. Some of these children should not get another dose of pertussis vaccine, but may get a vaccine without pertussis, called DT. 4. Older children and adults DTaP is not licensed for adolescents, adults, or children 58 years of age and older. But older people still need protection. A vaccine called Tdap is similar to DTaP. A single dose of Tdap is  recommended for people 11 through 58 years of age. Another vaccine, called Td, protects against tetanus and diphtheria, but not pertussis. It is recommended every 10 years. There are separate Vaccine Information Statements for these vaccines. 5. What are the risks from DTaP vaccine? Getting diphtheria, tetanus, or pertussis disease is much riskier than getting DTaP vaccine. However, a vaccine, like any medicine, is capable of causing serious problems, such as severe allergic reactions. The risk of DTaP vaccine causing serious harm, or death, is extremely small. Mild problems (common)  Fever (up to about 1 child in 4)  Redness or swelling where the shot was given (up to about 1 child in 4)  Soreness or tenderness where the shot was given (up to about 1 child in 4) These problems occur more often after the 4th and 5th doses of the DTaP series than after earlier doses. Sometimes the 4th or 5th dose of DTaP vaccine is followed by swelling of the entire arm or leg in which the shot was given, lasting 1-7 days (up to about 1 child in 48). Other mild problems include:  Fussiness (up to about 1 child in 3)  Tiredness or poor appetite (up to about 1 child in 74)  Vomiting (up to about 1 child in 53) These problems generally occur 1-3 days after the shot. Moderate problems (uncommon)  Seizure (jerking or staring) (about 1 child out of 14,000)  Non-stop crying, for 3 hours or more (up to about 1 child out of 1,000)  High fever, over 105F (about 1 child out of 16,000) Severe problems (very rare)  Serious allergic reaction (less than 1 out of a million doses)  Several other severe problems have been reported after DTaP vaccine. These include: ? Long-term seizures, coma, or lowered consciousness ? Permanent brain damage. These are so rare it is hard to tell if they are caused by the vaccine. Controlling fever is especially important for children who have had seizures, for any reason. It is also  important if another family member has had seizures. You can reduce fever and pain by giving your child an aspirin-free pain reliever when the shot is given, and for the next 24 hours, following the package instructions. 6. What if there is a serious reaction? What should I look for? Look for anything that concerns you, such as signs of a severe allergic reaction, very high fever, or behavior changes. Signs of a severe allergic reaction can include hives, swelling of the face and throat, difficulty breathing, a fast heartbeat, dizziness, and weakness. These would start a few minutes to a few hours after the vaccination. What should I do?  If you think it is a severe allergic reaction or other emergency that can't wait, call 9-1-1 or get the person to the nearest hospital. Otherwise, call your doctor.  Afterward, the reaction should be reported to the Vaccine Adverse Event Reporting System (VAERS). Your doctor might file this report, or you can do it yourself through the VAERS web site at www.vaers.SamedayNews.es, or by calling 640-479-1170. ? VAERS is only for reporting reactions. They do not give medical advice. 7. The National Vaccine Injury Compensation Program The [employer] Vaccine Injury Compensation Program (VICP) is a federal program that was created to compensate people who may have been injured by certain vaccines. Persons who believe they may have been injured by a vaccine can learn about the program and about filing a claim by calling 947-572-8623 or visiting the [place] website at GoldCloset.com.ee. 8. How can I learn more?  Ask your doctor.  Call your local or state health department.  Contact the Centers for Disease Control and Prevention (CDC): ? Call (442)567-6691 (1-800-CDC-INFO) or ? Visit CDC's website at http://hunter.com/ CDC DTaP Vaccine (Diphtheria, Tetanus, and Pertussis) VIS (05/18/05) This information is not intended to replace advice given to you by your  health care provider. Make sure you discuss any questions you have with your health care provider. Document Released: 10/16/2005 Document Revised: 09/09/2015 Document Reviewed: 09/09/2015 Elsevier Interactive Patient Education  2017 [employer].

## 2017-12-17 NOTE — Progress Notes (Signed)
Impression and Recommendations:    1. Encounter for wellness examination   2. Need for Tdap vaccination   3. Vision test   4. Vitamin D deficiency   5. History of prediabetes   6. Healthcare maintenance   7. Cervical cancer screening   8. Routine screening for STI in low risk individual    9. Screening for breast cancer   10. Health education/counseling   11. Family history of breast cancer   12. Encounter for screening fecal occult blood testing      1) Anticipatory Guidance: Discussed importance of wearing a seatbelt while driving, not texting while driving; sunscreen when outside along with yearly skin surveillance; eating a well balanced and modest diet; physical activity at least 25 minutes per day or 150 min/ week of moderate to intense activity. -Recommended that the patient follow up with an ophthalmologist for her glasses needs.  -Advised the patient to follow up with a dentist at least every 6 months.   2) Immunizations / Screenings / Labs:  All immunizations and screenings that patient agrees to, are up-to-date per recommendations or will be updated today.  Patient understands the needs for q 63mo dental and yearly vision screens which pt will schedule independently. Obtain CBC, CMP, HgA1c, Lipid panel, TSH and vit D when fasting if not already done recently.  -Discussed and advised routine screening in a sexually active adult consisted of HIV testing as recommended per CDC Guidelines. Patient agrees to this screening test today.  -Discussed that the shingles vaccination with the patient today, she will receive an information packet on this today.  -Advised the patient to check with her insurance regarding coverage for shingles.   3) Weight:   Discussed goal of losing even 5-10% of current body weight which would improve overall feelings of well being and improve objective health data significantly.   Improve nutrient density of diet through increasing intake of fruits  and vegetables and decreasing saturated/trans fats, white flour products and refined sugar products.   4) Elevated blood pressure -Blood pressure today at 151/88.  -Advised the patient that due to her elevated blood pressure, we will need for her to follow up in the near future to discuss this.   5) Vaginal Health -Patient notes increased vaginal dryness. Discussed with the patient regarding atrophic vaginal tissues and that an estrogen cream could be beneficial to aid with atrophic tissues.   -At this time, [facility] and will return to discuss vaginal estrogen cream PRN.   *Discussed with the patient today that if we were to discuss any chronic issues during this CPE, then they would be charged for an office visit. Patient signed paperwork acknowledging this and this was also discussed in person today.*   Follow up:  Patient will follow up in the next 1-2 weeks to discuss her blood work- she desires to get together to discuss since this will be the first time we did so.   Patient advised to follow up sooner if needed.   Meds ordered this encounter  Medications  . Tdap (BOOSTRIX) injection 0.5 mL    Orders Placed This Encounter  Procedures  . MM DIGITAL SCREENING BILATERAL  . CBC with Differential/Platelet  . Comprehensive metabolic panel  . Hemoglobin A1c  . Lipid panel  . T4, free  . TSH  . VITAMIN D 25 Hydroxy (Vit-D Deficiency, Fractures)  . Hepatitis C antibody  . HIV Antibody (routine testing w rflx)  .  Visual acuity screening  . POC Hemoccult Bld/Stl (1-Cd Office Dx)    Gross side effects, risk and benefits, and alternatives of medications discussed with patient.  Patient is aware that all medications have potential side effects and we are unable to predict every side effect or drug-drug interaction that may occur.  Expresses verbal understanding and consents to current therapy plan and treatment regimen.  F-up preventative CPE in 1 year. F/up  sooner for chronic care management as discussed and/or prn.  Please see orders placed and AVS handed out to patient at the end of our visit for further patient instructions/ counseling done pertaining to today's office visit.  This document serves as a record of services personally performed by Thomasene Lot, DO. It was created on her behalf by Chestine Spore, a trained medical scribe. The creation of this record is based on the scribe's personal observations and the provider's statements to them.   I have reviewed the above medical documentation for accuracy and completeness and I concur.  Thomasene Lot, DO 12/17/2017 9:33 AM    Subjective:    Chief Complaint  Patient presents with  . Annual Exam   CC:   HPI: Nicole Hawkins is a 57 y.o. female who presents to [facility] at [facility] today a yearly health maintenance exam. She has never had an abnormal pap smear or colonoscopy. She was advised to follow up in 10 years for a colonoscopy. She is sexually active with her husband and she is monogamous. Her maternal grandmother had breast cancer, however, she doesn't have any first degree relatives with breast cancer. Her [facility] on January 2019, she would like to have these occur locally if possible. She had a cyst to her breast in 2012 that was biopsied at [facility]. She hasn't had a shingles vaccine as of yet.  She denies having a tattoo or blood transfusion. She denies hx of anyone in her family with bone issues with bone density scans. She is due to be evaluated by an ophthalmologist.  She hasn't been to the dentist in over 2 years. She is walking at least 150 minutes per week and she is satisfied with her weight today. She notes increased vaginal dryness. She denies any concerns with her breast.   Health Maintenance Summary Reviewed and updated, unless pt declines services.  Colonoscopy:  Per patient it was normal and she was told  to follow up for screening in 10 years.  Tobacco History Reviewed:   Y  CT scan for screening lung CA:   Abdominal Ultrasound:     ( Unnecessary secondary to < 19 or > 70 years old) Alcohol:    No concerns, no excessive use Exercise Habits:   Not meeting AHA guidelines STD concerns:   none Drug Use:   None Birth control method:   n/a Menses regular:     n/a Lumps or breast concerns:      no Breast Cancer Family History:      No Bone/ DEXA scan:  ( Unnecessary due to < 65 and average risk)   Immunization History  Administered Date(s) Administered  . Influenza,inj,Quad PF,6+ Mos 10/26/2017  . Influenza,inj,quad, With Preservative 01/06/2016  . Tdap 01/03/2007, 12/17/2017    Health Maintenance  Topic Date Due  . PAP SMEAR-Modifier  12/17/2017 (Originally 10/20/1980)  . COLONOSCOPY  06/27/2018 (Originally 10/20/2009)  . TETANUS/TDAP  06/27/2018 (Originally 01/02/2017)  . Hepatitis C Screening  06/27/2018 (Originally 17-Mar-1960)  .  HIV Screening  06/27/2018 (Originally 10/21/1974)  . MAMMOGRAM  01/13/2019  . INFLUENZA VACCINE  Completed     Wt Readings from Last 3 Encounters:  12/17/17 120 lb 8 oz (54.7 kg)  06/26/17 119 lb (54 kg)  01/27/16 118 lb (53.5 kg)   BP Readings from Last 3 Encounters:  12/17/17 130/86  10/26/17 138/77  06/26/17 (!) 155/76   Pulse Readings from Last 3 Encounters:  12/17/17 80  10/26/17 69  06/26/17 80     History reviewed. No pertinent past medical history.    Past Surgical History:  Procedure Laterality Date  . DILATION AND CURETTAGE OF UTERUS        Family History  Problem Relation Age of Onset  . Diabetes Mother   . Hypertension Mother   . Stroke Mother   . Hypertension Father   . High Cholesterol Sister       Social History   Substance and Sexual Activity  Drug Use Never  ,   Social History   Substance and Sexual Activity  Alcohol Use Never  . Frequency: Never  ,   Social History   Tobacco Use  Smoking  Status Never Smoker  Smokeless Tobacco Never Used  ,   Social History   Substance and Sexual Activity  Sexual Activity Yes  . Partners: Male  . Birth control/protection: None    Current Outpatient Medications on File Prior to Visit  Medication Sig Dispense Refill  . Cholecalciferol (VITAMIN D3) 1000 units CAPS Take 1 capsule by mouth daily.     No current facility-administered medications on file prior to visit.     Allergies: Patient has no known allergies.  Review of Systems: General:   Denies fever, chills, unexplained weight loss.  Optho/Auditory:   Denies visual changes, blurred vision/LOV Respiratory:   Denies SOB, DOE more than baseline levels.  Cardiovascular:   Denies chest pain, palpitations, new onset peripheral edema  Gastrointestinal:   Denies nausea, vomiting, diarrhea.  Genitourinary: Denies dysuria, freq/ urgency, flank pain or discharge from genitals. (+) increased vaginal dryness.  Endocrine:     Denies hot or cold intolerance, polyuria, polydipsia. Musculoskeletal:   Denies unexplained myalgias, joint swelling, unexplained arthralgias, gait problems.  Skin:  Denies rash, suspicious lesions Neurological:     Denies dizziness, unexplained weakness, numbness  Psychiatric/Behavioral:   Denies mood changes, suicidal or homicidal ideations, hallucinations    Objective:    Blood pressure 130/86, pulse 80, temperature 97.6 F (36.4 C), height 5\' 3"  (1.6 m), weight 120 lb 8 oz (54.7 kg), SpO2 100 %. Body mass index is 21.35 kg/m. General Appearance:    Alert, cooperative, no distress, appears stated age  Head:    Normocephalic, without obvious abnormality, atraumatic  Eyes:    PERRL, conjunctiva/corneas clear, EOM's intact, fundi    benign, both eyes  Ears:    Normal TM's and external ear canals, both ears  Nose:   Nares normal, septum midline, mucosa normal, no drainage    or sinus tenderness  Throat:   Lips w/o lesion, mucosa moist, and tongue normal;  teeth and   gums normal  Neck:   Supple, symmetrical, trachea midline, no adenopathy;    thyroid:  no enlargement/tenderness/nodules; no carotid   bruit or JVD  Back:     Symmetric, no curvature, ROM normal, no CVA tenderness  Lungs:     Clear to auscultation bilaterally, respirations unlabored, no       Wh/ R/ R  Chest Wall:  No tenderness or gross deformity; normal excursion   Heart:    Regular rate and rhythm, S1 and S2 normal, no murmur, rub   or gallop  Breast Exam:    No tenderness, masses, or nipple abnormality b/l; no d/c  Abdomen:     Soft, non-tender, bowel sounds active all four quadrants, NO   G/R/R, no masses, no organomegaly  Genitalia:    Ext genitalia: without lesion, no rash or discharge, No         tenderness;  Cervix: WNL's w/o discharge or lesion;        Adnexa:  No tenderness or palpable masses   Rectal:    Normal tone, no masses or tenderness;   guaiac negative stool  Extremities:   Extremities normal, atraumatic, no cyanosis or gross edema  Pulses:   2+ and symmetric all extremities  Skin:   Warm, dry, Skin color, texture, turgor normal, no obvious rashes or lesions Psych: No HI/SI, judgement and insight good, Euthymic mood. Full Affect.  Neurologic:   CNII-XII intact, normal strength, sensation and reflexes    Throughout

## 2017-12-18 LAB — CBC WITH DIFFERENTIAL/PLATELET
BASOS: 1 %
Basophils Absolute: 0 10*3/uL (ref 0.0–0.2)
EOS (ABSOLUTE): 0.4 10*3/uL (ref 0.0–0.4)
Eos: 6 %
Hematocrit: 41.8 % (ref 34.0–46.6)
Hemoglobin: 13.1 g/dL (ref 11.1–15.9)
IMMATURE GRANULOCYTES: 0 %
Immature Grans (Abs): 0 10*3/uL (ref 0.0–0.1)
Lymphocytes Absolute: 2.1 10*3/uL (ref 0.7–3.1)
Lymphs: 35 %
MCH: 27 pg (ref 26.6–33.0)
MCHC: 31.3 g/dL — AB (ref 31.5–35.7)
MCV: 86 fL (ref 79–97)
Monocytes Absolute: 0.4 10*3/uL (ref 0.1–0.9)
Monocytes: 7 %
NEUTROS ABS: 3 10*3/uL (ref 1.4–7.0)
NEUTROS PCT: 51 %
PLATELETS: 265 10*3/uL (ref 150–450)
RBC: 4.86 x10E6/uL (ref 3.77–5.28)
RDW: 12.1 % — AB (ref 12.3–15.4)
WBC: 6 10*3/uL (ref 3.4–10.8)

## 2017-12-18 LAB — COMPREHENSIVE METABOLIC PANEL
A/G RATIO: 1.8 (ref 1.2–2.2)
ALK PHOS: 114 IU/L (ref 39–117)
ALT: 15 IU/L (ref 0–32)
AST: 19 IU/L (ref 0–40)
Albumin: 5 g/dL (ref 3.5–5.5)
BILIRUBIN TOTAL: 0.5 mg/dL (ref 0.0–1.2)
BUN/Creatinine Ratio: 13 (ref 9–23)
BUN: 13 mg/dL (ref 6–24)
CHLORIDE: 99 mmol/L (ref 96–106)
CO2: 26 mmol/L (ref 20–29)
Calcium: 10 mg/dL (ref 8.7–10.2)
Creatinine, Ser: 0.97 mg/dL (ref 0.57–1.00)
GFR calc Af Amer: 74 mL/min/{1.73_m2} (ref >59)
GFR calc non Af Amer: 65 mL/min/{1.73_m2} (ref >59)
GLOBULIN, TOTAL: 2.8 g/dL (ref 1.5–4.5)
Glucose: 83 mg/dL (ref 65–99)
POTASSIUM: 4.3 mmol/L (ref 3.5–5.2)
SODIUM: 138 mmol/L (ref 134–144)
Total Protein: 7.8 g/dL (ref 6.0–8.5)

## 2017-12-18 LAB — LIPID PANEL
CHOLESTEROL TOTAL: 158 mg/dL (ref 100–199)
Chol/HDL Ratio: 3.3 ratio (ref 0.0–4.4)
HDL: 48 mg/dL (ref >39)
LDL Calculated: 101 mg/dL — ABNORMAL HIGH (ref 0–99)
Triglycerides: 47 mg/dL (ref 0–149)
VLDL Cholesterol Cal: 9 mg/dL (ref 5–40)

## 2017-12-18 LAB — TSH: TSH: 1.45 u[IU]/mL (ref 0.450–4.500)

## 2017-12-18 LAB — HEMOGLOBIN A1C
ESTIMATED AVERAGE GLUCOSE: 114 mg/dL
Hgb A1c MFr Bld: 5.6 % (ref 4.8–5.6)

## 2017-12-18 LAB — HEPATITIS C ANTIBODY: Hep C Virus Ab: <0.1 s/co ratio (ref 0.0–0.9)

## 2017-12-18 LAB — HIV ANTIBODY (ROUTINE TESTING W REFLEX): HIV Screen 4th Generation wRfx: NONREACTIVE

## 2017-12-18 LAB — VITAMIN D 25 HYDROXY (VIT D DEFICIENCY, FRACTURES): Vit D, 25-Hydroxy: 36.7 ng/mL (ref 30.0–100.0)

## 2017-12-18 LAB — T4, FREE: Free T4: 1.22 ng/dL (ref 0.82–1.77)

## 2017-12-20 LAB — CYTOLOGY - PAP
DIAGNOSIS: NEGATIVE
HPV: NOT DETECTED

## 2018-01-17 ENCOUNTER — Telehealth: Payer: Self-pay

## 2018-01-17 NOTE — Telephone Encounter (Signed)
Patient brought in BP log as follows:   Please review and advise if any changes in treatment are needed. MPulliam, CMA/RT(R)   12.17.19 915 am  131/64 HR 70 arm     115/74 HR 64 wrist                114/61 HR  73 arm   124/78 HR 65 wrist 1012PM  120/70  HR 65 arm  114/79 HR 60 wrist   12.18.19 900 AM  133/73 HR 71 arm  125/79  HR  65 wrist    126/70  HR 66 arm 124/79  HR 64 wrist 1030 PM  121-67  HR 75 arm  109/72  HR 65 wrist  12.19.19 907 AM 122/85  HR 76 arm    105/78 HR 68 wrist    136/78 HR  70 arm    108/76 HR 68 wrist    130/67  HR 72 arm    114/75  HR 65 wrist 1015 PM  133/66  HR 69 arm     109/76  HR 66 wrist        122/73  HR 68     116/77  HR 72 wrist  12/24/17 830 PM  136/63  HR 74 arm        99/76  HR 70 wrist     118/63  HR 71 arm       106/73  HR 70 wrist  12.26.19  830 AM   131/65  HR 72 arm        110/73 HR 66 wrist                 134/74  HR 71 arm       105/70  HR 64 wrist  12.30.19 851 PM  122/72  HR 75 arm          114/75  HR 65 wrist                129/69  HR 69 arm         114/75 HR 60 wrist  12.31.19 1000 PM   130/66  HR 61 arm          100/74 HR 60 wrist                   133/65 HR 58 arm          100/78 HR 60 wrist  1.2.20 945 PM   104/58  HR 76 arm           106/70  HR 60 wrist                 122/62  HR 70 arm           115/80  HR 60 Wrist

## 2018-01-22 ENCOUNTER — Encounter: Payer: Self-pay | Admitting: Family Medicine

## 2018-01-22 ENCOUNTER — Ambulatory Visit (INDEPENDENT_AMBULATORY_CARE_PROVIDER_SITE_OTHER): Payer: BLUE CROSS/BLUE SHIELD | Admitting: Family Medicine

## 2018-01-22 VITALS — BP 137/73 | HR 83 | Temp 98.3°F | Ht 63.0 in | Wt 120.0 lb

## 2018-01-22 DIAGNOSIS — Z87898 Personal history of other specified conditions: Secondary | ICD-10-CM | POA: Diagnosis not present

## 2018-01-22 DIAGNOSIS — E559 Vitamin D deficiency, unspecified: Secondary | ICD-10-CM | POA: Diagnosis not present

## 2018-01-22 DIAGNOSIS — E785 Hyperlipidemia, unspecified: Secondary | ICD-10-CM | POA: Diagnosis not present

## 2018-01-22 MED ORDER — VITAMIN D (ERGOCALCIFEROL) 1.25 MG (50000 UNIT) PO CAPS: ORAL_CAPSULE | ORAL | 3 refills | Status: DC

## 2018-01-22 NOTE — Patient Instructions (Addendum)
Guidelines for a Low Cholesterol, Low Saturated Fat Diet   Fats - Limit total intake of fats and oils. - Avoid butter, stick margarine, shortening, lard, palm and coconut oils. - Limit mayonnaise, salad dressings, gravies and sauces, unless they are homemade with low-fat ingredients. - Limit chocolate. - Choose low-fat and nonfat products, such as low-fat mayonnaise, low-fat or non-hydrogenated peanut butter, low-fat or fat-free salad dressings and nonfat gravy. - Use vegetable oil, such as canola or olive oil. - Look for margarine that does not contain trans fatty acids. - Use nuts in moderate amounts. - Read ingredient labels carefully to determine both amount and type of fat present in foods. Limit saturated and trans fats! - Avoid high-fat processed and convenience foods.  Meats and Meat Alternatives - Choose fish, chicken, turkey and lean meats. - Use dried beans, peas, lentils and tofu. - Limit egg yolks to three to four per week. - If you eat red meat, limit to no more than three servings per week and choose loin or round cuts. - Avoid fatty meats, such as bacon, sausage, franks, luncheon meats and ribs. - Avoid all organ meats, including liver.  Dairy - Choose nonfat or low-fat milk, yogurt and cottage cheese. - Most cheeses are high in fat. Choose cheeses made from non-fat milk, such as mozzarella and ricotta cheese. - Choose light or fat-free cream cheese and sour cream. - Avoid cream and sauces made with cream.  Fruits and Vegetables - Eat a wide variety of fruits and vegetables. - Use lemon juice, vinegar or "mist" olive oil on vegetables. - Avoid adding sauces, fat or oil to vegetables.  Breads, Cereals and Grains - Choose whole-grain breads, cereals, pastas and rice. - Avoid high-fat snack foods, such as granola, cookies, pies, pastries, doughnuts and croissants.  Cooking Tips - Avoid deep fried foods. - Trim visible fat off meats and remove skin from poultry  before cooking. - Bake, broil, boil, poach or roast poultry, fish and lean meats. - Drain and discard fat that drains out of meat as you cook it. - Add little or no fat to foods. - Use vegetable oil sprays to grease pans for cooking or baking. - Steam vegetables. - Use herbs or no-oil marinades to flavor foods. Please realize, EXERCISE IS MEDICINE!  -  American Heart Association ( AHA) guidelines for exercise : If you are in good health, without any medical conditions, you should engage in 150-300 minutes of moderate intensity aerobic activity per week.  This means you should be huffing and puffing throughout your workout.   Engaging in regular exercise will improve brain function and memory, as well as improve mood, boost immune system and help with weight management.  As well as the other, more well-known effects of exercise such as decreasing blood sugar levels, decreasing blood pressure,  and decreasing bad cholesterol levels/ increasing good cholesterol levels.     -  The AHA strongly endorses consumption of a diet that contains a variety of foods from all the food categories with an emphasis on fruits and vegetables; fat-free and low-fat dairy products; cereal and grain products; legumes and nuts; and fish, poultry, and/or extra lean meats.    Excessive food intake, especially of foods high in saturated and trans fats, sugar, and salt, should be avoided.    Adequate water intake of roughly 1/2 of your weight in pounds, should equal the ounces of water per day you should drink.  So for instance, if you're 200 pounds,   that would be 100 ounces of water per day.         Mediterranean Diet  Why follow it? Research shows. . Those who follow the Mediterranean diet have a reduced risk of heart disease  . The diet is associated with a reduced incidence of Parkinson's and Alzheimer's diseases . People following the diet may have longer life expectancies and lower rates of chronic diseases  . The  Dietary Guidelines for Americans recommends the Mediterranean diet as an eating plan to promote health and prevent disease  What Is the Mediterranean Diet?  . Healthy eating plan based on typical foods and recipes of Mediterranean-style cooking . The diet is primarily a plant based diet; these foods should make up a majority of meals   Starches - Plant based foods should make up a majority of meals - They are an important sources of vitamins, minerals, energy, antioxidants, and fiber - Choose whole grains, foods high in fiber and minimally processed items  - Typical grain sources include wheat, oats, barley, corn, brown rice, bulgar, farro, millet, polenta, couscous  - Various types of beans include chickpeas, lentils, fava beans, black beans, white beans   Fruits  Veggies - Large quantities of antioxidant rich fruits & veggies; 6 or more servings  - Vegetables can be eaten raw or lightly drizzled with oil and cooked  - Vegetables common to the traditional Mediterranean Diet include: artichokes, arugula, beets, broccoli, brussel sprouts, cabbage, carrots, celery, collard greens, cucumbers, eggplant, kale, leeks, lemons, lettuce, mushrooms, okra, onions, peas, peppers, potatoes, pumpkin, radishes, rutabaga, shallots, spinach, sweet potatoes, turnips, zucchini - Fruits common to the Mediterranean Diet include: apples, apricots, avocados, cherries, clementines, dates, figs, grapefruits, grapes, melons, nectarines, oranges, peaches, pears, pomegranates, strawberries, tangerines  Fats - Replace butter and margarine with healthy oils, such as olive oil, canola oil, and tahini  - Limit nuts to no more than a handful a day  - Nuts include walnuts, almonds, pecans, pistachios, pine nuts  - Limit or avoid candied, honey roasted or heavily salted nuts - Olives are central to the Mediterranean diet - can be eaten whole or used in a variety of dishes   Meats Protein - Limiting red meat: no more than a few  times a month - When eating red meat: choose lean cuts and keep the portion to the size of deck of cards - Eggs: approx. 0 to 4 times a week  - Fish and lean poultry: at least 2 a week  - Healthy protein sources include, chicken, turkey, lean beef, lamb - Increase intake of seafood such as tuna, salmon, trout, mackerel, shrimp, scallops - Avoid or limit high fat processed meats such as sausage and bacon  Dairy - Include moderate amounts of low fat dairy products  - Focus on healthy dairy such as fat free yogurt, skim milk, low or reduced fat cheese - Limit dairy products higher in fat such as whole or 2% milk, cheese, ice cream  Alcohol - Moderate amounts of red wine is ok  - No more than 5 oz daily for women (all ages) and men older than age 65  - No more than 10 oz of wine daily for men younger than 65  Other - Limit sweets and other desserts  - Use herbs and spices instead of salt to flavor foods  - Herbs and spices common to the traditional Mediterranean Diet include: basil, bay leaves, chives, cloves, cumin, fennel, garlic, lavender, marjoram, mint, oregano, parsley, pepper, rosemary,   sage, savory, sumac, tarragon, thyme   It's not just a diet, it's a lifestyle:  . The Mediterranean diet includes lifestyle factors typical of those in the region  . Foods, drinks and meals are best eaten with others and savored . Daily physical activity is important for overall good health . This could be strenuous exercise like running and aerobics . This could also be more leisurely activities such as walking, housework, yard-work, or taking the stairs . Moderation is the key; a balanced and healthy diet accommodates most foods and drinks . Consider portion sizes and frequency of consumption of certain foods   Meal Ideas & Options:  . Breakfast:  o Whole wheat toast or whole wheat English muffins with peanut butter & hard boiled egg o Steel cut oats topped with apples & cinnamon and skim milk   o Fresh fruit: banana, strawberries, melon, berries, peaches  o Smoothies: strawberries, bananas, greek yogurt, peanut butter o Low fat greek yogurt with blueberries and granola  o Egg white omelet with spinach and mushrooms o Breakfast couscous: whole wheat couscous, apricots, skim milk, cranberries  . Sandwiches:  o Hummus and grilled vegetables (peppers, zucchini, squash) on whole wheat bread   o Grilled chicken on whole wheat pita with lettuce, tomatoes, cucumbers or tzatziki  o Tuna salad on whole wheat bread: tuna salad made with greek yogurt, olives, red peppers, capers, green onions o Garlic rosemary lamb pita: lamb sauted with garlic, rosemary, salt & pepper; add lettuce, cucumber, greek yogurt to pita - flavor with lemon juice and black pepper  . Seafood:  o Mediterranean grilled salmon, seasoned with garlic, basil, parsley, lemon juice and black pepper o Shrimp, lemon, and spinach whole-grain pasta salad made with low fat greek yogurt  o Seared scallops with lemon orzo  o Seared tuna steaks seasoned salt, pepper, coriander topped with tomato mixture of olives, tomatoes, olive oil, minced garlic, parsley, green onions and cappers  . Meats:  o Herbed greek chicken salad with kalamata olives, cucumber, feta  o Red bell peppers stuffed with spinach, bulgur, lean ground beef (or lentils) & topped with feta   o Kebabs: skewers of chicken, tomatoes, onions, zucchini, squash  o Turkey burgers: made with red onions, mint, dill, lemon juice, feta cheese topped with roasted red peppers . Vegetarian o Cucumber salad: cucumbers, artichoke hearts, celery, red onion, feta cheese, tossed in olive oil & lemon juice  o Hummus and whole grain pita points with a greek salad (lettuce, tomato, feta, olives, cucumbers, red onion) o Lentil soup with celery, carrots made with vegetable broth, garlic, salt and pepper  o Tabouli salad: parsley, bulgur, mint, scallions, cucumbers, tomato, radishes, lemon  juice, olive oil, salt and pepper.   

## 2018-01-22 NOTE — Progress Notes (Signed)
Assessment and plan:  1. Hyperlipidemia, unspecified hyperlipidemia type- diet controlled   2. History of prediabetes   3. Vitamin D deficiency      Hyperlipidemia, unspecified hyperlipidemia type- diet controlled  History of prediabetes  Vitamin D deficiency - Plan: Vitamin D, Ergocalciferol, (DRISDOL) 1.25 MG (50000 UT) CAPS capsule    Labs:  -Reviewed labs from 12/17/2017 with the patient today. -Hemoccult, CMP, Hemoglobin A1c, Free T4, TSH, vitamin D, Hep C, and HIV antibody WNL -Creatinine at 0.97 as of 12/17/2017. Advised that the patient remain hydrated and consume half of her weight in ounces of water per day. Additionally, discussed that the patient should exercise at least 150 minutes per week. Advised that the patient not take Advil, Aleve, or Ibuprofen a lot as it could be causing issues with her kidneys.   HTN:  -Discussed with the patient that we would like her blood pressure to maintain at or less than 135/85.  -Advised that the patient to maintain a blood pressure log at home.   Cholesterol:  -Lipid panel from 12/17/2017 showed LDL at 101, HDL at 48, and Triglycerides at 47.  -Dietary changes such as low saturated & trans fat and low carb/ketogenic diets discussed with patient.  Encouraged regular exercise and weight loss when appropriate.  -10 year ASCVD risk score if 7.5% or more then the patient would be started on medications. Advised that since the patient is at 4.7%, then there isn't a need to start her on medications yet.  -Educational handouts provided at patient's desire. -Contact us prior with any Q's/ concerns.  Vitamin D Deficiency:  -Vitamin D levels at 36.7 as of 12/17/2017.  -Patient notes that she takes 1,000 IU's Vitamin D daily  -Will prescribe Ergocalciferol prescription to be taken weekly.  -Recheck in 3-4 months.   Hx of Pre-diabetes:  -A1c on 12/17/2017 at 5.6 and now in  normal range.  -Advised patient to continue to monitor their carb intake.   Follow up PRN.     Education and routine counseling performed. Handouts provided.   Meds ordered this encounter  Medications  . Vitamin D, Ergocalciferol, (DRISDOL) 1.25 MG (50000 UT) CAPS capsule    Sig: 1 tab Every SUNDAY    Dispense:  12 capsule    Refill:  3    Return for 49mo for chronic care- reck vit D, BP log, diet/ exercise etc.   Anticipatory guidance and routine counseling done re: condition, txmnt options and need for follow up. All questions of patient's were answered.   Gross side effects, risk and benefits, and alternatives of medications discussed with patient.  Patient is aware that all medications have potential side effects and we are unable to predict every sideeffect or drug-drug interaction that may occur.  Expresses verbal understanding and consents to current therapy plan and treatment regiment.  Please see AVS handed out to patient at the end of our visit for additional patient instructions/ counseling done pertaining to today's office visit.  Note:  This document was prepared using Dragon voice recognition software and may include unintentional dictation errors.  This document serves as a record of services personally performed by Thomasene Lot, DO. It was created on her behalf by Chestine Spore, a trained medical scribe. The creation of this record is based on the scribe's personal observations and the provider's statements to them.   I have reviewed the above medical documentation for accuracy and completeness and I concur.  Thomasene Lot,  DO 01/27/2018 6:58 PM   ----------------------------------------------------------------------------------------------------------------------  Subjective:   CC:   Nicole Hawkins is a 58 y.o. female who presents to [facility] at [facility] today for review and discussion of recent bloodwork that was done in addition to  f/up on chronic conditions we are managing for pt.  1. All recent blood work that we ordered was reviewed with patient today.  Patient was counseled on all abnormalities and we discussed dietary and lifestyle changes that could help those values (also medications when appropriate).  Extensive health counseling performed and all patient's concerns/ questions were addressed.  See labs below and also plan for more details of these abnormalities  HTN:  She brought her blood pressure log in today and notes that her blood pressure has been average of 130/80. She walks two miles a day for 35 minutes daily.   Vitamin D:  She takes 1,000 IU daily of vitamin D medications.    Wt Readings from Last 3 Encounters:  01/22/18 120 lb (54.4 kg)  12/17/17 120 lb 8 oz (54.7 kg)  06/26/17 119 lb (54 kg)   BP Readings from Last 3 Encounters:  01/22/18 137/73  12/17/17 130/86  10/26/17 138/77   Pulse Readings from Last 3 Encounters:  01/22/18 83  12/17/17 80  10/26/17 69   BMI Readings from Last 3 Encounters:  01/22/18 21.26 kg/m  12/17/17 21.35 kg/m  06/26/17 21.08 kg/m     Patient Care Team    Relationship Specialty Notifications Start End  Thomasene Lot, DO PCP - General Family Medicine  06/26/17     Full medical history updated and reviewed in the office today  Patient Active Problem List   Diagnosis Date Noted  . Hyperlipidemia 01/22/2018  . Health education/counseling 12/17/2017  . Vitamin D deficiency 06/26/2017  . Family history of diabetes mellitus (DM)-first-degree relative 06/26/2017  . Family history of breast cancer 06/26/2017  . Family history of essential hypertension-in first-degree relative 06/26/2017  . Family history of hyperlipidemia 06/26/2017  . History of postpartum depression 06/26/2017  . History of prediabetes 06/26/2017  . Family history of depression 06/26/2017  . Epistaxis, recurrent 03/17/2016    History reviewed. No pertinent past medical  history.  Past Surgical History:  Procedure Laterality Date  . DILATION AND CURETTAGE OF UTERUS      Social History   Tobacco Use  . Smoking status: Never Smoker  . Smokeless tobacco: Never Used  Substance Use Topics  . Alcohol use: Never    Frequency: Never    Family Hx: Family History  Problem Relation Age of Onset  . Diabetes Mother   . Hypertension Mother   . Stroke Mother   . Hypertension Father   . High Cholesterol Sister      Medications: Current Outpatient Medications  Medication Sig Dispense Refill  . Cholecalciferol (VITAMIN D3) 1000 units CAPS Take 1 capsule by mouth daily.    . Vitamin D, Ergocalciferol, (DRISDOL) 1.25 MG (50000 UT) CAPS capsule 1 tab Every SUNDAY 12 capsule 3   No current facility-administered medications for this visit.     Allergies:  No Known Allergies   Review of Systems: General:   No F/C, wt loss Pulm:   No DIB, SOB, pleuritic chest pain Card:  No CP, palpitations Abd:  No n/v/d or pain Ext:  No inc edema from baseline  Objective:  Blood pressure 137/73, pulse 83, temperature 98.3 F (36.8 C), height 5\' 3"  (1.6 m), weight 120 lb (  54.4 kg), SpO2 100 %. Body mass index is 21.26 kg/m. Gen:   Well NAD, A and O *3 HEENT:    [place]/AT, EOMI,  MMM Lungs:   Normal work of breathing. CTA B/L, no Wh, rhonchi Heart:   RRR, S1, S2 WNL's, no MRG Abd:   No gross distention Exts:    warm, pink,  Brisk capillary refill, warm and well perfused.  Psych:    No HI/SI, judgement and insight good, Euthymic mood. Full Affect.   Recent Results (from the past 2160 hour(s))  Cytology - PAP     Status: None   Collection Time: 12/17/17 12:00 AM  Result Value Ref Range   Adequacy      Satisfactory for evaluation  endocervical/transformation zone component PRESENT.   Diagnosis      NEGATIVE FOR INTRAEPITHELIAL LESIONS OR MALIGNANCY.   HPV NOT DETECTED     Comment: Normal Reference Range - NOT Detected   Material Submitted CervicoVaginal Pap  [ThinPrep Imaged]    CYTOLOGY - PAP PAP RESULT   Hepatitis C antibody     Status: None   Collection Time: 12/17/17  8:56 AM  Result Value Ref Range   Hep C Virus Ab <0.1 0.0 - 0.9 s/co ratio    Comment:                                   Negative:     < 0.8                              Indeterminate: 0.8 - 0.9                                   Positive:     > 0.9  The CDC recommends that a positive HCV antibody result  be followed up with a HCV Nucleic Acid Amplification  test ([id number]).   HIV Antibody (routine testing w rflx)     Status: None   Collection Time: 12/17/17  8:56 AM  Result Value Ref Range   HIV Screen 4th Generation wRfx Non Reactive Non Reactive  CBC with Differential/Platelet     Status: Abnormal   Collection Time: 12/17/17  8:59 AM  Result Value Ref Range   WBC 6.0 3.4 - 10.8 x10E3/uL   RBC 4.86 3.77 - 5.28 x10E6/uL   Hemoglobin 13.1 11.1 - 15.9 g/dL   Hematocrit 04.541.8 40.934.0 - 46.6 %   MCV 86 79 - 97 fL   MCH 27.0 26.6 - 33.0 pg   MCHC 31.3 (L) 31.5 - 35.7 g/dL   RDW 81.112.1 (L) 91.412.3 - 78.215.4 %    Comment: **Effective January 07, 2018, the RDW pediatric reference**   interval will be removed and the adult reference interval   will be changing to:                             Female 11.7 - 15.4                                                      Female 11.6 -  15.4    Platelets 265 150 - 450 x10E3/uL   Neutrophils 51 Not Estab. %   Lymphs 35 Not Estab. %   Monocytes 7 Not Estab. %   Eos 6 Not Estab. %   Basos 1 Not Estab. %   Neutrophils Absolute 3.0 1.4 - 7.0 x10E3/uL   Lymphocytes Absolute 2.1 0.7 - 3.1 x10E3/uL   Monocytes Absolute 0.4 0.1 - 0.9 x10E3/uL   EOS (ABSOLUTE) 0.4 0.0 - 0.4 x10E3/uL   Basophils Absolute 0.0 0.0 - 0.2 x10E3/uL   Immature Granulocytes 0 Not Estab. %   Immature Grans (Abs) 0.0 0.0 - 0.1 x10E3/uL  Comprehensive metabolic panel     Status: None   Collection Time: 12/17/17  8:59 AM  Result Value Ref Range   Glucose 83 65 - 99 mg/dL   BUN  13 6 - 24 mg/dL   Creatinine, Ser 2.97 0.57 - 1.00 mg/dL   GFR calc non Af Amer 65 >59 mL/min/1.73   GFR calc Af Amer 74 >59 mL/min/1.73   BUN/Creatinine Ratio 13 9 - 23   Sodium 138 134 - 144 mmol/L   Potassium 4.3 3.5 - 5.2 mmol/L   Chloride 99 96 - 106 mmol/L   CO2 26 20 - 29 mmol/L   Calcium 10.0 8.7 - 10.2 mg/dL   Total Protein 7.8 6.0 - 8.5 g/dL   Albumin 5.0 3.5 - 5.5 g/dL   Globulin, Total 2.8 1.5 - 4.5 g/dL   Albumin/Globulin Ratio 1.8 1.2 - 2.2   Bilirubin Total 0.5 0.0 - 1.2 mg/dL   Alkaline Phosphatase 114 39 - 117 IU/L   AST 19 0 - 40 IU/L   ALT 15 0 - 32 IU/L  Hemoglobin A1c     Status: None   Collection Time: 12/17/17  8:59 AM  Result Value Ref Range   Hgb A1c MFr Bld 5.6 4.8 - 5.6 %    Comment:          Prediabetes: 5.7 - 6.4          Diabetes: >6.4          Glycemic control for adults with diabetes: <7.0    Est. average glucose Bld gHb Est-mCnc 114 mg/dL  Lipid panel     Status: Abnormal   Collection Time: 12/17/17  8:59 AM  Result Value Ref Range   Cholesterol, Total 158 100 - 199 mg/dL   Triglycerides 47 0 - 149 mg/dL   HDL 48 >98 mg/dL   VLDL Cholesterol Cal 9 5 - 40 mg/dL   LDL Calculated 921 (H) 0 - 99 mg/dL   Chol/HDL Ratio 3.3 0.0 - 4.4 ratio    Comment:                                   T. Chol/HDL Ratio                                             Men  Women                               1/2 Avg.Risk  3.4    3.3  Avg.Risk  5.0    4.4                                2X Avg.Risk  9.6    7.1                                3X Avg.Risk 23.4   11.0   T4, free     Status: None   Collection Time: 12/17/17  8:59 AM  Result Value Ref Range   Free T4 1.22 0.82 - 1.77 ng/dL  TSH     Status: None   Collection Time: 12/17/17  8:59 AM  Result Value Ref Range   TSH 1.450 0.450 - 4.500 uIU/mL  VITAMIN D 25 Hydroxy (Vit-D Deficiency, Fractures)     Status: None   Collection Time: 12/17/17  8:59 AM  Result Value Ref Range    Vit D, 25-Hydroxy 36.7 30.0 - 100.0 ng/mL    Comment: Vitamin D deficiency has been defined by the Institute of Medicine and an Endocrine Society practice guideline as a level of serum 25-OH vitamin D less than 20 ng/mL (1,2). The Endocrine Society went on to further define vitamin D insufficiency as a level between 21 and 29 ng/mL (2). 1. IOM (Institute of Medicine). 2010. Dietary reference    intakes for calcium and D. [place]: The    [employer]. 2. Holick MF, Binkley Troy, Bischoff-Ferrari HA, et al.    Evaluation, treatment, and prevention of vitamin D    deficiency: an Endocrine Society clinical practice    guideline. JCEM. 2011 Jul; 96(7):1911-30.   POC Hemoccult Bld/Stl (1-Cd Office Dx)     Status: Normal   Collection Time: 12/17/17  9:03 AM  Result Value Ref Range   Card #1 Date 12/17/17    Fecal Occult Blood, POC Negative Negative

## 2018-01-22 NOTE — Telephone Encounter (Signed)
Patient notified. MPulliam, CMA/RT(R)  

## 2018-01-22 NOTE — Telephone Encounter (Signed)
Looks good

## 2018-01-22 NOTE — Telephone Encounter (Signed)
Blood pressures all look good.  No need for change in treatment

## 2018-02-14 ENCOUNTER — Ambulatory Visit
Admission: RE | Admit: 2018-02-14 | Discharge: 2018-02-14 | Disposition: A | Discharge status: Home / Self Care | Payer: BLUE CROSS/BLUE SHIELD | Source: Ambulatory Visit | Attending: Family Medicine | Admitting: Family Medicine

## 2018-02-14 DIAGNOSIS — Z1239 Encounter for other screening for malignant neoplasm of breast: Secondary | ICD-10-CM

## 2018-02-14 DIAGNOSIS — Z803 Family history of malignant neoplasm of breast: Secondary | ICD-10-CM

## 2018-02-14 DIAGNOSIS — Z1231 Encounter for screening mammogram for malignant neoplasm of breast: Secondary | ICD-10-CM | POA: Diagnosis not present

## 2018-02-21 ENCOUNTER — Other Ambulatory Visit: Payer: Self-pay | Admitting: Family Medicine

## 2018-02-21 DIAGNOSIS — R928 Other abnormal and inconclusive findings on diagnostic imaging of breast: Secondary | ICD-10-CM

## 2018-02-28 ENCOUNTER — Ambulatory Visit
Admission: RE | Admit: 2018-02-28 | Discharge: 2018-02-28 | Disposition: A | Discharge status: Home / Self Care | Payer: BLUE CROSS/BLUE SHIELD | Source: Ambulatory Visit | Attending: Family Medicine | Admitting: Family Medicine

## 2018-02-28 DIAGNOSIS — R928 Other abnormal and inconclusive findings on diagnostic imaging of breast: Secondary | ICD-10-CM

## 2018-02-28 DIAGNOSIS — R922 Inconclusive mammogram: Secondary | ICD-10-CM | POA: Diagnosis not present

## 2018-02-28 DIAGNOSIS — N6489 Other specified disorders of breast: Secondary | ICD-10-CM | POA: Diagnosis not present

## 2018-03-14 ENCOUNTER — Ambulatory Visit (INDEPENDENT_AMBULATORY_CARE_PROVIDER_SITE_OTHER): Payer: BLUE CROSS/BLUE SHIELD | Admitting: Family Medicine

## 2018-03-14 ENCOUNTER — Other Ambulatory Visit: Payer: Self-pay

## 2018-03-14 ENCOUNTER — Encounter: Payer: Self-pay | Admitting: Family Medicine

## 2018-03-14 VITALS — BP 136/83 | HR 86 | Ht 63.0 in | Wt 123.7 lb

## 2018-03-14 DIAGNOSIS — M21932 Unspecified acquired deformity of left forearm: Secondary | ICD-10-CM | POA: Diagnosis not present

## 2018-03-14 NOTE — Progress Notes (Signed)
Impression and Recommendations:    1. Acquired boney deformity of left wrist      1. Left Wrist - Possible causes of patient's symptoms reviewed and discussed. - Discussed difference between bony growth and ganglion cyst. - Reviewed that it appears to be a repetitive injury due to her job.  - Discussed concerns that the area grew over the past two weeks. - Per patient, she did not change any of her habits recently to explain this.  - Discussed that obtaining X-ray today will not change patient management as she will need a referral to Orthopedics regardless, where an X-ray will be obtained.   - Discussed need for referral to Orthopedic Hand/Upper Extremity Specialist. - Referral to Orthopedics provided today.  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  - Extensive education provided today.  2. Prudent Practices during COVID-19 - Discussed prudent precautions and hand hygiene practices. - Encouraged patient to avoid nonessential crowds or gatherings. - Education provided today about prevention of illness, and all questions were answered.    Orders Placed This Encounter  Procedures  . Ambulatory referral to Orthopedic Surgery    No orders of the defined types were placed in this encounter.   Medications Discontinued During This Encounter  Medication Reason  . Cholecalciferol (VITAMIN D3) 1000 units CAPS Patient Preference     Gross side effects, risk and benefits, and alternatives of medications and treatment plan in general discussed with patient.  Patient is aware that all medications have potential side effects and we are unable to predict every side effect or drug-drug interaction that may occur.   Patient will call with any questions prior to using medication if they have concerns.    Expresses verbal understanding and consents to current  therapy and treatment regimen.  No barriers to understanding were identified.  Red flag symptoms and signs discussed in detail.  Patient expressed understanding regarding what to do in case of emergency\urgent symptoms  Please see AVS handed out to patient at the end of our visit for further patient instructions/ counseling done pertaining to today's office visit.   Return if symptoms worsen or fail to improve, for F-up of current med issues as previously d/c pt.     Note:  This note was prepared with assistance of Dragon voice recognition software. Occasional wrong-word or sound-a-like substitutions may have occurred due to the inherent limitations of voice recognition software.   This document serves as a record of services personally performed by Thomasene Lot, DO. It was created on her behalf by Peggye Fothergill, a trained medical scribe. The creation of this record is based on the scribe's personal observations and the provider's statements to them.   I have reviewed the above medical documentation for accuracy and completeness and I concur.  Thomasene Lot, DO 03/27/2018 4:28 PM       ---------------------------------------------------------------------------------------------------------------------------------    Subjective:     HPI: Nicole Hawkins is a 58 y.o. female who presents to [facility] at [facility] today for issues as discussed below.  Notes she has a knot on her left wrist.  Says "It's been there for a little while, but while got bigger the last two weeks."  States that she is constantly using her hands at work."  Says that the area used to be a little bump, which enlarged over the  past two weeks and became painful.  "When it was small, it wasn't painful."  Denies radiation of pain.  "Usually when it hurts, it just hurts right there [over the area of swelling]."  Patient has had a new job since December.  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  She confirms that she engages in repetitive motion all day every day.  She thinks she works with about fifty shirts per hour, "it depends on what's in the box."  She works with about 20 other people at her job.  Notes she hasn't changed any of her recent activities at work to explain this.   States that the pain is not too bothersome, and does not cause too much dysfunction performing daily tasks.  However, if she bends her thumb, patient does begin to feel pain.    Wt Readings from Last 3 Encounters:  03/14/18 123 lb 11.2 oz (56.1 kg)  01/22/18 120 lb (54.4 kg)  12/17/17 120 lb 8 oz (54.7 kg)   BP Readings from Last 3 Encounters:  03/14/18 136/83  01/22/18 137/73  12/17/17 130/86   Pulse Readings from Last 3 Encounters:  03/14/18 86  01/22/18 83  12/17/17 80   BMI Readings from Last 3 Encounters:  03/14/18 21.91 kg/m  01/22/18 21.26 kg/m  12/17/17 21.35 kg/m     Patient Care Team    Relationship Specialty Notifications Start End  Thomasene Lot, DO PCP - General Family Medicine  06/26/17      Patient Active Problem List   Diagnosis Date Noted  . Hyperlipidemia 01/22/2018  . Health education/counseling 12/17/2017  . Vitamin D deficiency 06/26/2017  . Family history of diabetes mellitus (DM)-first-degree relative 06/26/2017  . Family history of breast cancer 06/26/2017  . Family history of essential hypertension-in first-degree relative 06/26/2017  . Family history of hyperlipidemia 06/26/2017  . History of postpartum depression 06/26/2017  . History of prediabetes 06/26/2017  . Family history of depression 06/26/2017  . Epistaxis, recurrent 03/17/2016    Past Medical history, Surgical history, Family history, Social history, Allergies and Medications have been entered into the medical record, reviewed and changed as needed.    Current Meds  Medication Sig  . Vitamin D, Ergocalciferol, (DRISDOL) 1.25 MG (50000 UT) CAPS capsule 1 tab  Every SUNDAY    Allergies:  No Known Allergies   Review of Systems:  A fourteen system review of systems was performed and found to be positive as per HPI.   Objective:   Blood pressure 136/83, pulse 86, height 5\' 3"  (1.6 m), weight 123 lb 11.2 oz (56.1 kg), SpO2 100 %. Body mass index is 21.91 kg/m. General:  Well Developed, well nourished, appropriate for stated age.  Neuro:  Alert and oriented,  extra-ocular muscles intact  HEENT:  Normocephalic, atraumatic, neck supple, no carotid bruits appreciated  Skin:  no gross rash, warm, pink. Cardiac:  RRR, S1 S2 Respiratory:  ECTA B/L and A/P, Not using accessory muscles, speaking in full sentences- unlabored. Vascular:  Ext warm, no cyanosis apprec.; cap RF less 2 sec. Psych:  No HI/SI, judgement and insight good, Euthymic mood. Full Affect. Left Wrist: Patient with bony prominence, lateral aspect of distal radius on the left that is tender to palpation.

## 2018-03-26 ENCOUNTER — Encounter (INDEPENDENT_AMBULATORY_CARE_PROVIDER_SITE_OTHER): Payer: Self-pay | Admitting: Orthopaedic Surgery

## 2018-03-26 ENCOUNTER — Ambulatory Visit (INDEPENDENT_AMBULATORY_CARE_PROVIDER_SITE_OTHER): Payer: BLUE CROSS/BLUE SHIELD | Admitting: Orthopaedic Surgery

## 2018-03-26 ENCOUNTER — Ambulatory Visit (INDEPENDENT_AMBULATORY_CARE_PROVIDER_SITE_OTHER): Payer: BLUE CROSS/BLUE SHIELD

## 2018-03-26 ENCOUNTER — Other Ambulatory Visit: Payer: Self-pay

## 2018-03-26 DIAGNOSIS — M25532 Pain in left wrist: Secondary | ICD-10-CM

## 2018-03-26 DIAGNOSIS — M654 Radial styloid tenosynovitis [de Quervain]: Secondary | ICD-10-CM | POA: Diagnosis not present

## 2018-03-26 DIAGNOSIS — M67432 Ganglion, left wrist: Secondary | ICD-10-CM

## 2018-03-26 DIAGNOSIS — E785 Hyperlipidemia, unspecified: Secondary | ICD-10-CM

## 2018-03-26 DIAGNOSIS — E559 Vitamin D deficiency, unspecified: Secondary | ICD-10-CM

## 2018-03-26 NOTE — Progress Notes (Signed)
Office Visit Note   Patient: Nicole Hawkins           Date of Birth: 08-22-1960           MRN: [id number] Visit Date: 03/26/2018              Requested by: Thomasene Lot, DO [address] [place], [place] [address] PCP: Thomasene Lot, DO   Assessment & Plan: Visit Diagnoses:  1. Left wrist pain     Plan: Impression is mild left de Quervain's tenosynovitis with ganglion cyst likely emanating from the first dorsal wrist compartment.  We reviewed the condition in detail and discuss treatment options and she would like to start with immobilization and relative rest.  Overall the pain is not significant enough to warrant an injection today.  Questions encouraged and answered.  Follow-up as needed.  Follow-Up Instructions: Return if symptoms worsen or fail to improve.   Orders:  Orders Placed This Encounter  Procedures  . XR Wrist Complete Left   No orders of the defined types were placed in this encounter.     Procedures: No procedures performed   Clinical Data: No additional findings.   Subjective: Chief Complaint  Patient presents with  . Left Wrist - Pain    Nicole Hawkins is a very pleasant 58 year old female comes in with development of a left wrist cyst on the radial aspect of her wrist near the radial styloid for the last month.  She denies any definite injuries.  She does use her hands repetitively for work.  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  She denies any numbness and tingling.  Denies any catching pain or any tendon subluxation.  She has been wearing an OTC thumb spica brace.     Review of Systems  Constitutional: Negative.   HENT: Negative.   Eyes: Negative.   Respiratory: Negative.   Cardiovascular: Negative.   Endocrine: Negative.   Musculoskeletal: Negative.   Neurological: Negative.   Hematological: Negative.   Psychiatric/Behavioral: Negative.   All other systems reviewed and are negative.    Objective: Vital Signs:  There were no vitals taken for this visit.  Physical Exam Vitals signs and nursing note reviewed.  Constitutional:      Appearance: She is well-developed.  HENT:     Head: Normocephalic and atraumatic.  Neck:     Musculoskeletal: Neck supple.  Pulmonary:     Effort: Pulmonary effort is normal.  Abdominal:     Palpations: Abdomen is soft.  Skin:    General: Skin is warm.     Capillary Refill: Capillary refill takes less than 2 seconds.  Neurological:     Mental Status: She is alert and oriented to person, place, and time.  Psychiatric:        Behavior: Behavior normal.        Thought Content: Thought content normal.        Judgment: Judgment normal.     Ortho Exam Left wrist exam shows small palpable cyst on the radial aspect of the wrist overlying the first dorsal wrist compartment.  There is no tendon subluxation.  Positive Finkelstein's. Specialty Comments:  No specialty comments available.  Imaging: Xr Wrist Complete Left  Result Date: 03/26/2018 No acute or structural abnormalities.    PMFS History: Patient Active Problem List   Diagnosis Date Noted  . Hyperlipidemia 01/22/2018  . Health education/counseling 12/17/2017  . Vitamin D deficiency 06/26/2017  . Family history of diabetes mellitus (DM)-first-degree relative  06/26/2017  . Family history of breast cancer 06/26/2017  . Family history of essential hypertension-in first-degree relative 06/26/2017  . Family history of hyperlipidemia 06/26/2017  . History of postpartum depression 06/26/2017  . History of prediabetes 06/26/2017  . Family history of depression 06/26/2017  . Epistaxis, recurrent 03/17/2016   History reviewed. No pertinent past medical history.  Family History  Problem Relation Age of Onset  . Diabetes Mother   . Hypertension Mother   . Stroke Mother   . Hypertension Father   . High Cholesterol Sister     Past Surgical History:  Procedure Laterality Date  . BREAST BIOPSY Right     cyst  . DILATION AND CURETTAGE OF UTERUS     Social History   Occupational History  . Not on file  Tobacco Use  . Smoking status: Never Smoker  . Smokeless tobacco: Never Used  Substance and Sexual Activity  . Alcohol use: Never    Frequency: Never  . Drug use: Never  . Sexual activity: Yes    Partners: Male    Birth control/protection: None

## 2018-07-23 ENCOUNTER — Ambulatory Visit: Payer: BLUE CROSS/BLUE SHIELD | Admitting: Family Medicine

## 2018-07-30 ENCOUNTER — Encounter: Payer: Self-pay | Admitting: Family Medicine

## 2018-07-30 ENCOUNTER — Other Ambulatory Visit: Payer: Self-pay

## 2018-07-30 ENCOUNTER — Ambulatory Visit (INDEPENDENT_AMBULATORY_CARE_PROVIDER_SITE_OTHER): Payer: BC Managed Care – PPO | Admitting: Family Medicine

## 2018-07-30 VITALS — BP 148/78 | HR 86 | Temp 98.7°F | Ht 63.0 in | Wt 125.5 lb

## 2018-07-30 DIAGNOSIS — R030 Elevated blood-pressure reading, without diagnosis of hypertension: Secondary | ICD-10-CM

## 2018-07-30 DIAGNOSIS — E785 Hyperlipidemia, unspecified: Secondary | ICD-10-CM

## 2018-07-30 DIAGNOSIS — Z87898 Personal history of other specified conditions: Secondary | ICD-10-CM | POA: Diagnosis not present

## 2018-07-30 DIAGNOSIS — E559 Vitamin D deficiency, unspecified: Secondary | ICD-10-CM

## 2018-07-30 DIAGNOSIS — Z719 Counseling, unspecified: Secondary | ICD-10-CM

## 2018-07-30 NOTE — Progress Notes (Signed)
Impression and Recommendations:    1. History of prediabetes   2. Hyperlipidemia, unspecified hyperlipidemia type- diet controlled   3. Vitamin D deficiency   4. Health education/counseling   5. White coat syndrome without diagnosis of hypertension     - Discussed prudent health practices during COVID.  Advised patient to avoid spending over 15 minutes in an enclosed space among other people.  - Physical examination recommended in future.  Vitamin D Deficiency - 36.7 last check - Patient tolerating once weekly supplementation well. - Last checked in December of 2019.  - Continue supplementation as prescribed.  See med list. - Will continue to monitor.  Hyperlipidemia, Diet Controlled - LDL = 101 last check. - Health counseling performed.  - Ongoing prudent dietary changes such as low saturated & trans fat and low carb/low cholesterol diets discussed with patient.  Encouraged regular exercise and physical conditioning when appropriate.  - Continue management as recommended. - Will continue to monitor.  White Coat Syndrome w/out Hypertension Dx - Elevated on intake today. - Encouraged continued ambulatory blood pressure monitoring once or twice weekly.  - Will continue to monitor.  History of Prediabetes - A1c 5.7 over two years ago - Ongoing prudent dietary changes encouraged. - Continue to cut down on carb intake and management as recommended.  - Will continue to monitor.  Lifestyle & Preventative Health Maintenance - Advised patient to continue working toward exercising to improve overall mental, physical, and emotional health.    American Heart Association guidelines for healthy diet, basically Mediterranean diet, and exercise guidelines of 30 minutes 5 days per week or more discussed in detail.  Health counseling performed.  All questions answered.  - Encouraged patient to engage in daily physical activity, especially a formal exercise routine.   Recommended that the patient eventually strive for at least 150 minutes of moderate cardiovascular activity per week according to guidelines established by the [facility].   - Healthy dietary habits encouraged, including low-carb, and high amounts of lean protein in diet.   - Patient should also consume adequate amounts of water.  Recommendations - Follow up mid-December with [facility]W.  Patient will call with any questions prior to using medication if they have concerns.    Expresses verbal understanding and consents to current therapy and treatment regimen.  No barriers to understanding were identified.  Red flag symptoms and signs discussed in detail.  Patient expressed understanding regarding what to do in case of emergency\urgent symptoms  Please see AVS handed out to patient at the end of our visit for further patient instructions/ counseling done pertaining to today's office visit.   Return for f/up in mid dec for CPE w FBW same day.     Note:  This note was prepared with assistance of Dragon voice recognition software. Occasional wrong-word or sound-a-like substitutions may have occurred due to the inherent limitations of voice recognition software.   This document serves as a record of services personally performed by Thomasene Loteborah Blondina Coderre, DO. It was created on her behalf by Peggye FothergillKatherine Galloway, a trained medical scribe. The creation of this record is based on the scribe's personal observations and the provider's statements to them.   I have reviewed the above medical documentation for accuracy and completeness and I concur.  Thomasene Loteborah Chandria Rookstool, DO 07/30/2018 8:00 PM     --------------------------------------------------------------------------------------------------------------------------------------------------------------------------------------------------------------------------------------------    Subjective:     HPI: Nicole Hawkins is a 58 y.o. female [facility] today for issues as discussed below.  States she is doing everything she can possibly do not to catch COVID.  However, she states she has been going to church and sitting in an enclosed space with fellow church members.  History of Elevated Blood Pressure Notes that her blood pressure rises at the doctor's office, but is always fine at home.  - Blood Pressure at Home: 123/73 115/78 116/70 130/82 108/64 106/78 94/67 was lowest 139/60 was highest  Vitamin D Deficiency Patient continues taking supplementation as prescribed and tolerating well.  Diet-Controlled Hyperlipidemia & History of Prediabetes Patient has tried to change her diet and intake to control her lipids and prevent diabetes.  She has tried to cut down on her bread and cheese intake.  She notes that "potato chips are my go-to, [place] plain, or [employer]."    Denies chest pain, SOB, or swelling in the legs.  Exercise & Lifestyle Habits At work, states that she's standing and walking all day, but is not engaging in formal exercise.   Wt Readings from Last 3 Encounters:  07/30/18 125 lb 8 oz (56.9 kg)  03/14/18 123 lb 11.2 oz (56.1 kg)  01/22/18 120 lb (54.4 kg)   BP Readings from Last 3 Encounters:  07/30/18 (!) 148/78  03/14/18 136/83  01/22/18 137/73   Pulse Readings from Last 3 Encounters:  07/30/18 86  03/14/18 86  01/22/18 83   BMI Readings from Last 3 Encounters:  07/30/18 22.23 kg/m  03/14/18 21.91 kg/m  01/22/18 21.26 kg/m     Patient Care Team    Relationship Specialty Notifications Start End  Mellody Dance, DO PCP - General Family Medicine  06/26/17      Patient Active Problem List   Diagnosis Date Noted  . Hyperlipidemia 01/22/2018  . Health education/counseling 12/17/2017  . Vitamin D deficiency 06/26/2017  . Family history of diabetes mellitus (DM)-first-degree relative 06/26/2017  . Family history of breast cancer 06/26/2017  . Family history of  essential hypertension-in first-degree relative 06/26/2017  . Family history of hyperlipidemia 06/26/2017  . History of postpartum depression 06/26/2017  . History of prediabetes 06/26/2017  . Family history of depression 06/26/2017  . Epistaxis, recurrent 03/17/2016    Past Medical history, Surgical history, Family history, Social history, Allergies and Medications have been entered into the medical record, reviewed and changed as needed.    Current Meds  Medication Sig  . Vitamin D, Ergocalciferol, (DRISDOL) 1.25 MG (50000 UT) CAPS capsule 1 tab Every SUNDAY    Allergies:  No Known Allergies   Review of Systems:  A fourteen system review of systems was performed and found to be positive as per HPI.   Objective:   Blood pressure (!) 148/78, pulse 86, temperature 98.7 F (37.1 C), height 5\' 3"  (1.6 m), weight 125 lb 8 oz (56.9 kg), SpO2 100 %. Body mass index is 22.23 kg/m. General:  Well Developed, well nourished, appropriate for stated age.  Neuro:  Alert and oriented,  extra-ocular muscles intact  HEENT:  Normocephalic, atraumatic, neck supple, no carotid bruits appreciated  Skin:  no gross rash, warm, pink. Cardiac:  RRR, S1 S2 Respiratory:  ECTA B/L and A/P, Not using accessory muscles, speaking in full sentences- unlabored. Vascular:  Ext warm, no cyanosis apprec.; cap RF less 2 sec. Psych:  No HI/SI, judgement and insight good, Euthymic mood. Full Affect.

## 2018-07-30 NOTE — Patient Instructions (Signed)

## 2018-10-09 ENCOUNTER — Other Ambulatory Visit: Payer: Self-pay

## 2018-10-09 ENCOUNTER — Ambulatory Visit (INDEPENDENT_AMBULATORY_CARE_PROVIDER_SITE_OTHER): Payer: BC Managed Care – PPO

## 2018-10-09 DIAGNOSIS — Z23 Encounter for immunization: Secondary | ICD-10-CM

## 2018-11-15 ENCOUNTER — Other Ambulatory Visit: Payer: Self-pay

## 2018-11-15 DIAGNOSIS — Z20822 Contact with and (suspected) exposure to covid-19: Secondary | ICD-10-CM

## 2018-11-16 DIAGNOSIS — H9202 Otalgia, left ear: Secondary | ICD-10-CM | POA: Diagnosis not present

## 2018-11-16 DIAGNOSIS — R6883 Chills (without fever): Secondary | ICD-10-CM | POA: Diagnosis not present

## 2018-11-18 ENCOUNTER — Ambulatory Visit: Payer: Self-pay | Admitting: *Deleted

## 2018-11-18 ENCOUNTER — Telehealth: Payer: Self-pay | Admitting: *Deleted

## 2018-11-18 LAB — NOVEL CORONAVIRUS, NAA: SARS-CoV-2, NAA: DETECTED — AB

## 2018-11-18 NOTE — Telephone Encounter (Signed)
See lab result note  11/18/2018

## 2018-11-18 NOTE — Telephone Encounter (Signed)
Pt notified of covid results in lab results.

## 2018-12-04 ENCOUNTER — Encounter: Payer: BC Managed Care – PPO | Admitting: Family Medicine

## 2018-12-31 ENCOUNTER — Other Ambulatory Visit: Payer: Self-pay

## 2018-12-31 ENCOUNTER — Other Ambulatory Visit: Payer: BC Managed Care – PPO

## 2018-12-31 ENCOUNTER — Ambulatory Visit (INDEPENDENT_AMBULATORY_CARE_PROVIDER_SITE_OTHER): Payer: BC Managed Care – PPO | Admitting: Family Medicine

## 2018-12-31 ENCOUNTER — Encounter: Payer: Self-pay | Admitting: Family Medicine

## 2018-12-31 VITALS — BP 162/84 | HR 86 | Temp 97.8°F | Resp 14 | Ht 63.0 in | Wt 117.3 lb

## 2018-12-31 DIAGNOSIS — Z833 Family history of diabetes mellitus: Secondary | ICD-10-CM | POA: Diagnosis not present

## 2018-12-31 DIAGNOSIS — Z87898 Personal history of other specified conditions: Secondary | ICD-10-CM

## 2018-12-31 DIAGNOSIS — Z719 Counseling, unspecified: Secondary | ICD-10-CM | POA: Diagnosis not present

## 2018-12-31 DIAGNOSIS — R030 Elevated blood-pressure reading, without diagnosis of hypertension: Secondary | ICD-10-CM | POA: Diagnosis not present

## 2018-12-31 DIAGNOSIS — Z0000 Encounter for general adult medical examination without abnormal findings: Secondary | ICD-10-CM

## 2018-12-31 DIAGNOSIS — E785 Hyperlipidemia, unspecified: Secondary | ICD-10-CM

## 2018-12-31 DIAGNOSIS — E559 Vitamin D deficiency, unspecified: Secondary | ICD-10-CM

## 2018-12-31 NOTE — Patient Instructions (Addendum)
Please make sure to call your insurance and check on the Shingrix vaccine, and call back to [place] with information.  Please return stool cards to the clinic in near future as well.     Preventive Care for Adults, Female  A healthy lifestyle and preventive care can promote health and wellness. Preventive health guidelines for women include the following key practices.   A routine yearly physical is a good way to check with your health care provider about your health and preventive screening. It is a chance to share any concerns and updates on your health and to receive a thorough exam.   Visit your dentist for a routine exam and preventive care every 6 months. Brush your teeth twice a day and floss once a day. Good oral hygiene prevents tooth decay and gum disease.   The frequency of eye exams is based on your age, health, family medical history, use of contact lenses, and other factors. Follow your health care provider's recommendations for frequency of eye exams.   Eat a healthy diet. Foods like vegetables, fruits, whole grains, low-fat dairy products, and lean protein foods contain the nutrients you need without too many calories. Decrease your intake of foods high in solid fats, added sugars, and salt. Eat the right amount of calories for you.Get information about a proper diet from your health care provider, if necessary.   Regular physical exercise is one of the most important things you can do for your health. Most adults should get at least 150 minutes of moderate-intensity exercise (any activity that increases your heart rate and causes you to sweat) each week. In addition, most adults need muscle-strengthening exercises on 2 or more days a week.   Maintain a healthy weight. The body mass index (BMI) is a screening tool to identify possible weight problems. It provides an estimate of body fat based on height and weight. Your health care provider can find your BMI, and can help you  achieve or maintain a healthy weight.For adults 20 years and older:   - A BMI below 18.5 is considered underweight.   - A BMI of 18.5 to 24.9 is normal.   - A BMI of 25 to 29.9 is considered overweight.   - A BMI of 30 and above is considered obese.   Maintain normal blood lipids and cholesterol levels by exercising and minimizing your intake of trans and saturated fats.  Eat a balanced diet with plenty of fruit and vegetables. Blood tests for lipids and cholesterol should begin at age 81 and be repeated every 5 years minimum.  If your lipid or cholesterol levels are high, you are over 40, or you are at high risk for heart disease, you may need your cholesterol levels checked more frequently.Ongoing high lipid and cholesterol levels should be treated with medicines if diet and exercise are not working.   If you smoke, find out from your health care provider how to quit. If you do not use tobacco, do not start.   Lung cancer screening is recommended for adults aged 19-80 years who are at high risk for developing lung cancer because of a history of smoking. A yearly low-dose CT scan of the lungs is recommended for people who have at least a 30-pack-year history of smoking and are a current smoker or have quit within the past 15 years. A pack year of smoking is smoking an average of 1 pack of cigarettes a day for 1 year (for example: 1 pack a  day for 30 years or 2 packs a day for 15 years). Yearly screening should continue until the smoker has stopped smoking for at least 15 years. Yearly screening should be stopped for people who develop a health problem that would prevent them from having lung cancer treatment.   If you are pregnant, do not drink alcohol. If you are breastfeeding, be very cautious about drinking alcohol. If you are not pregnant and choose to drink alcohol, do not have more than 1 drink per day. One drink is considered to be 12 ounces (355 mL) of beer, 5 ounces (148 mL) of wine, or  1.5 ounces (44 mL) of liquor.   Avoid use of street drugs. Do not share needles with anyone. Ask for help if you need support or instructions about stopping the use of drugs.   High blood pressure causes heart disease and increases the risk of stroke. Your blood pressure should be checked at least yearly.  Ongoing high blood pressure should be treated with medicines if weight loss and exercise do not work.   If you are 19-28 years old, ask your health care provider if you should take aspirin to prevent strokes.   Diabetes screening involves taking a blood sample to check your fasting blood sugar level. This should be done once every 3 years, after age 67, if you are within normal weight and without risk factors for diabetes. Testing should be considered at a younger age or be carried out more frequently if you are overweight and have at least 1 risk factor for diabetes.   Breast cancer screening is essential preventive care for women. You should practice "breast self-awareness."  This means understanding the normal appearance and feel of your breasts and may include breast self-examination.  Any changes detected, no matter how small, should be reported to a health care provider.  Women in their 33s and 30s should have a clinical breast exam (CBE) by a health care provider as part of a regular health exam every 1 to 3 years.  After age 56, women should have a CBE every year.  Starting at age 59, women should consider having a mammogram (breast X-ray test) every year.  Women who have a family history of breast cancer should talk to their health care provider about genetic screening.  Women at a high risk of breast cancer should talk to their health care providers about having an MRI and a mammogram every year.   -Breast cancer gene (BRCA)-related cancer risk assessment is recommended for women who have family members with BRCA-related cancers. BRCA-related cancers include breast, ovarian, tubal, and  peritoneal cancers. Having family members with these cancers may be associated with an increased risk for harmful changes (mutations) in the breast cancer genes BRCA1 and BRCA2. Results of the assessment will determine the need for genetic counseling and BRCA1 and BRCA2 testing.   The Pap test is a screening test for cervical cancer. A Pap test can show cell changes on the cervix that might become cervical cancer if left untreated. A Pap test is a procedure in which cells are obtained and examined from the lower end of the uterus (cervix).   - Women should have a Pap test starting at age 12.   - Between ages 64 and 44, Pap tests should be repeated every 2 years.   - Beginning at age 56, you should have a Pap test every 3 years as long as the past 3 Pap tests have been normal.   -  Some women have medical problems that increase the chance of getting cervical cancer. Talk to your health care provider about these problems. It is especially important to talk to your health care provider if a new problem develops soon after your last Pap test. In these cases, your health care provider may recommend more frequent screening and Pap tests.   - The above recommendations are the same for women who have or have not gotten the vaccine for human papillomavirus (HPV).   - If you had a hysterectomy for a problem that was not cancer or a condition that could lead to cancer, then you no longer need Pap tests. Even if you no longer need a Pap test, a regular exam is a good idea to make sure no other problems are starting.   - If you are between ages 59 and 39 years, and you have had normal Pap tests going back 10 years, you no longer need Pap tests. Even if you no longer need a Pap test, a regular exam is a good idea to make sure no other problems are starting.   - If you have had past treatment for cervical cancer or a condition that could lead to cancer, you need Pap tests and screening for cancer for at least 20  years after your treatment.   - If Pap tests have been discontinued, risk factors (such as a new sexual partner) need to be reassessed to determine if screening should be resumed.   - The HPV test is an additional test that may be used for cervical cancer screening. The HPV test looks for the virus that can cause the cell changes on the cervix. The cells collected during the Pap test can be tested for HPV. The HPV test could be used to screen women aged 51 years and older, and should be used in women of any age who have unclear Pap test results. After the age of 20, women should have HPV testing at the same frequency as a Pap test.   Colorectal cancer can be detected and often prevented. Most routine colorectal cancer screening begins at the age of 108 years and continues through age 10 years. However, your health care provider may recommend screening at an earlier age if you have risk factors for colon cancer. On a yearly basis, your health care provider may provide home test kits to check for hidden blood in the stool.  Use of a small camera at the end of a tube, to directly examine the colon (sigmoidoscopy or colonoscopy), can detect the earliest forms of colorectal cancer. Talk to your health care provider about this at age 15, when routine screening begins. Direct exam of the colon should be repeated every 5 -10 years through age 62 years, unless early forms of pre-cancerous polyps or small growths are found.   People who are at an increased risk for hepatitis B should be screened for this virus. You are considered at high risk for hepatitis B if:  -You were born in a country where hepatitis B occurs often. Talk with your health care provider about which countries are considered high risk.  - Your parents were born in a high-risk country and you have not received a shot to protect against hepatitis B (hepatitis B vaccine).  - You have HIV or AIDS.  - You use needles to inject street drugs.  - You  live with, or have sex with, someone who has Hepatitis B.  - You get hemodialysis  treatment.  - You take certain medicines for conditions like cancer, organ transplantation, and autoimmune conditions.   Hepatitis C blood testing is recommended for all people born from [birth year] through 1965 and any individual with known risks for hepatitis C.   Practice safe sex. Use condoms and avoid high-risk sexual practices to reduce the spread of sexually transmitted infections (STIs). STIs include gonorrhea, chlamydia, syphilis, trichomonas, herpes, HPV, and human immunodeficiency virus (HIV). Herpes, HIV, and HPV are viral illnesses that have no cure. They can result in disability, cancer, and death. Sexually active women aged 46 years and younger should be checked for chlamydia. Older women with new or multiple partners should also be tested for chlamydia. Testing for other STIs is recommended if you are sexually active and at increased risk.   Osteoporosis is a disease in which the bones lose minerals and strength with aging. This can result in serious bone fractures or breaks. The risk of osteoporosis can be identified using a bone density scan. Women ages 63 years and over and women at risk for fractures or osteoporosis should discuss screening with their health care providers. Ask your health care provider whether you should take a calcium supplement or vitamin D to There are also several preventive steps women can take to avoid osteoporosis and resulting fractures or to keep osteoporosis from worsening. -->Recommendations include:  Eat a balanced diet high in fruits, vegetables, calcium, and vitamins.  Get enough calcium. The recommended total intake of is 1,200 mg daily; for best absorption, if taking supplements, divide doses into 250-500 mg doses throughout the day. Of the two types of calcium, calcium carbonate is best absorbed when taken with food but calcium citrate can be taken on an empty  stomach.  Get enough vitamin D. [employer] and the [employer] recommend at least 1,000 IU per day for women age 60 and over who are at risk of vitamin D deficiency. Vitamin D deficiency can be caused by inadequate sun exposure (for example, those who live in [place]).  Avoid alcohol and smoking. Heavy alcohol intake (more than 7 drinks per week) increases the risk of falls and hip fracture and women smokers tend to lose bone more rapidly and have lower bone mass than nonsmokers. Stopping smoking is one of the most important changes women can make to improve their health and decrease risk for disease.  Be physically active every day. Weight-bearing exercise (for example, fast walking, hiking, jogging, and weight training) may strengthen bones or slow the rate of bone loss that comes with aging. Balancing and muscle-strengthening exercises can reduce the risk of falling and fracture.  Consider therapeutic medications. Currently, several types of effective drugs are available. Healthcare providers can recommend the type most appropriate for each woman.  Eliminate environmental factors that may contribute to accidents. Falls cause nearly 90% of all osteoporotic fractures, so reducing this risk is an important bone-health strategy. Measures include ample lighting, removing obstructions to walking, using nonskid rugs on floors, and placing mats and/or grab bars in showers.  Be aware of medication side effects. Some common medicines make bones weaker. These include a type of steroid drug called glucocorticoids used for arthritis and asthma, some antiseizure drugs, certain sleeping pills, treatments for endometriosis, and some cancer drugs. An overactive thyroid gland or using too much thyroid hormone for an underactive thyroid can also be a problem. If you are taking these medicines, talk to your doctor about what you can do to help protect your bones.reduce  the rate of  osteoporosis.    Menopause can be associated with physical symptoms and risks. Hormone replacement therapy is available to decrease symptoms and risks. You should talk to your health care provider about whether hormone replacement therapy is right for you.   Use sunscreen. Apply sunscreen liberally and repeatedly throughout the day. You should seek shade when your shadow is shorter than you. Protect yourself by wearing long sleeves, pants, a wide-brimmed hat, and sunglasses year round, whenever you are outdoors.   Once a month, do a whole body skin exam, using a mirror to look at the skin on your back. Tell your health care provider of new moles, moles that have irregular borders, moles that are larger than a pencil eraser, or moles that have changed in shape or color.   -Stay current with required vaccines (immunizations).   Influenza vaccine. All adults should be immunized every year.  Tetanus, diphtheria, and acellular pertussis (Td, Tdap) vaccine. Pregnant women should receive 1 dose of Tdap vaccine during each pregnancy. The dose should be obtained regardless of the length of time since the last dose. Immunization is preferred during the 27th 36th week of gestation. An adult who has not previously received Tdap or who does not know her vaccine status should receive 1 dose of Tdap. This initial dose should be followed by tetanus and diphtheria toxoids (Td) booster doses every 10 years. Adults with an unknown or incomplete history of completing a 3-dose immunization series with Td-containing vaccines should begin or complete a primary immunization series including a Tdap dose. Adults should receive a Td booster every 10 years.  Varicella vaccine. An adult without evidence of immunity to varicella should receive 2 doses or a second dose if she has previously received 1 dose. Pregnant females who do not have evidence of immunity should receive the first dose after pregnancy. This first dose  should be obtained before leaving the health care facility. The second dose should be obtained 4 8 weeks after the first dose.  Human papillomavirus (HPV) vaccine. Females aged 65 26 years who have not received the vaccine previously should obtain the 3-dose series. The vaccine is not recommended for use in pregnant females. However, pregnancy testing is not needed before receiving a dose. If a female is found to be pregnant after receiving a dose, no treatment is needed. In that case, the remaining doses should be delayed until after the pregnancy. Immunization is recommended for any person with an immunocompromised condition through the age of 58 years if she did not get any or all doses earlier. During the 3-dose series, the second dose should be obtained 4 8 weeks after the first dose. The third dose should be obtained 24 weeks after the first dose and 16 weeks after the second dose.  Zoster vaccine. One dose is recommended for adults aged 68 years or older unless certain conditions are present.  Measles, mumps, and rubella (MMR) vaccine. Adults born before 29 generally are considered immune to measles and mumps. Adults born in [birth year] or later should have 1 or more doses of MMR vaccine unless there is a contraindication to the vaccine or there is laboratory evidence of immunity to each of the three diseases. A routine second dose of MMR vaccine should be obtained at least 28 days after the first dose for students attending postsecondary schools, health care workers, or international travelers. People who received inactivated measles vaccine or an unknown type of measles vaccine during 1963 1967 should receive  2 doses of MMR vaccine. People who received inactivated mumps vaccine or an unknown type of mumps vaccine before 1979 and are at high risk for mumps infection should consider immunization with 2 doses of MMR vaccine. For females of childbearing age, rubella immunity should be determined. If there is  no evidence of immunity, females who are not pregnant should be vaccinated. If there is no evidence of immunity, females who are pregnant should delay immunization until after pregnancy. Unvaccinated health care workers born before 30 who lack laboratory evidence of measles, mumps, or rubella immunity or laboratory confirmation of disease should consider measles and mumps immunization with 2 doses of MMR vaccine or rubella immunization with 1 dose of MMR vaccine.  Pneumococcal 13-valent conjugate (PCV13) vaccine. When indicated, a person who is uncertain of her immunization history and has no record of immunization should receive the PCV13 vaccine. An adult aged 60 years or older who has certain medical conditions and has not been previously immunized should receive 1 dose of PCV13 vaccine. This PCV13 should be followed with a dose of pneumococcal polysaccharide (PPSV23) vaccine. The PPSV23 vaccine dose should be obtained at least 8 weeks after the dose of PCV13 vaccine. An adult aged 71 years or older who has certain medical conditions and previously received 1 or more doses of PPSV23 vaccine should receive 1 dose of PCV13. The PCV13 vaccine dose should be obtained 1 or more years after the last PPSV23 vaccine dose.  Pneumococcal polysaccharide (PPSV23) vaccine. When PCV13 is also indicated, PCV13 should be obtained first. All adults aged 12 years and older should be immunized. An adult younger than age 28 years who has certain medical conditions should be immunized. Any person who resides in a nursing home or long-term care facility should be immunized. An adult smoker should be immunized. People with an immunocompromised condition and certain other conditions should receive both PCV13 and PPSV23 vaccines. People with human immunodeficiency virus (HIV) infection should be immunized as soon as possible after diagnosis. Immunization during chemotherapy or radiation therapy should be avoided. Routine use of  PPSV23 vaccine is not recommended for American Indians, [place] Natives, or people younger than 65 years unless there are medical conditions that require PPSV23 vaccine. When indicated, people who have unknown immunization and have no record of immunization should receive PPSV23 vaccine. One-time revaccination 5 years after the first dose of PPSV23 is recommended for people aged 105 64 years who have chronic kidney failure, nephrotic syndrome, asplenia, or immunocompromised conditions. People who received 1 2 doses of PPSV23 before age 56 years should receive another dose of PPSV23 vaccine at age 70 years or later if at least 5 years have passed since the previous dose. Doses of PPSV23 are not needed for people immunized with PPSV23 at or after age 33 years.  Meningococcal vaccine. Adults with asplenia or persistent complement component deficiencies should receive 2 doses of quadrivalent meningococcal conjugate (MenACWY-D) vaccine. The doses should be obtained at least 2 months apart. Microbiologists working with certain meningococcal bacteria, [employer] recruits, people at risk during an outbreak, and people who travel to or live in countries with a high rate of meningitis should be immunized. A first-year college student up through age 76 years who is living in a residence hall should receive a dose if she did not receive a dose on or after her 16th birthday. Adults who have certain high-risk conditions should receive one or more doses of vaccine.  Hepatitis A vaccine. Adults who wish to be  protected from this disease, have certain high-risk conditions, work with hepatitis A-infected animals, work in hepatitis A research labs, or travel to or work in countries with a high rate of hepatitis A should be immunized. Adults who were previously unvaccinated and who anticipate close contact with an international adoptee during the first 60 days after arrival in the [place] from a country with a high rate of  hepatitis A should be immunized.  Hepatitis B vaccine.  Adults who wish to be protected from this disease, have certain high-risk conditions, may be exposed to blood or other infectious body fluids, are household contacts or sex partners of hepatitis B positive people, are clients or workers in certain care facilities, or travel to or work in countries with a high rate of hepatitis B should be immunized.  Haemophilus influenzae type b (Hib) vaccine. A previously unvaccinated person with asplenia or sickle cell disease or having a scheduled splenectomy should receive 1 dose of Hib vaccine. Regardless of previous immunization, a recipient of a hematopoietic stem cell transplant should receive a 3-dose series 6 12 months after her successful transplant. Hib vaccine is not recommended for adults with HIV infection.  Preventive Services / Frequency Ages 34 to 39years  Blood pressure check.** / Every 1 to 2 years.  Lipid and cholesterol check.** / Every 5 years beginning at age 17.  Clinical breast exam.** / Every 3 years for women in their 15s and 99s.  BRCA-related cancer risk assessment.** / For women who have family members with a BRCA-related cancer (breast, ovarian, tubal, or peritoneal cancers).  Pap test.** / Every 2 years from ages 27 through 50. Every 3 years starting at age 48 through age 40 or 75 with a history of 3 consecutive normal Pap tests.  HPV screening.** / Every 3 years from ages 72 through ages 77 to 67 with a history of 3 consecutive normal Pap tests.  Hepatitis C blood test.** / For any individual with known risks for hepatitis C.  Skin self-exam. / Monthly.  Influenza vaccine. / Every year.  Tetanus, diphtheria, and acellular pertussis (Tdap, Td) vaccine.** / Consult your health care provider. Pregnant women should receive 1 dose of Tdap vaccine during each pregnancy. 1 dose of Td every 10 years.  Varicella vaccine.** / Consult your health care provider. Pregnant  females who do not have evidence of immunity should receive the first dose after pregnancy.  HPV vaccine. / 3 doses over 6 months, if 47 and younger. The vaccine is not recommended for use in pregnant females. However, pregnancy testing is not needed before receiving a dose.  Measles, mumps, rubella (MMR) vaccine.** / You need at least 1 dose of MMR if you were born in 1957 or later. You may also need a 2nd dose. For females of childbearing age, rubella immunity should be determined. If there is no evidence of immunity, females who are not pregnant should be vaccinated. If there is no evidence of immunity, females who are pregnant should delay immunization until after pregnancy.  Pneumococcal 13-valent conjugate (PCV13) vaccine.** / Consult your health care provider.  Pneumococcal polysaccharide (PPSV23) vaccine.** / 1 to 2 doses if you smoke cigarettes or if you have certain conditions.  Meningococcal vaccine.** / 1 dose if you are age 70 to 55 years and a Market researcher living in a residence hall, or have one of several medical conditions, you need to get vaccinated against meningococcal disease. You may also need additional booster doses.  Hepatitis A  vaccine.** / Consult your health care provider.  Hepatitis B vaccine.** / Consult your health care provider.  Haemophilus influenzae type b (Hib) vaccine.** / Consult your health care provider.  Ages 76 to 64years  Blood pressure check.** / Every 1 to 2 years.  Lipid and cholesterol check.** / Every 5 years beginning at age 28 years.  Lung cancer screening. / Every year if you are aged 75 80 years and have a 30-pack-year history of smoking and currently smoke or have quit within the past 15 years. Yearly screening is stopped once you have quit smoking for at least 15 years or develop a health problem that would prevent you from having lung cancer treatment.  Clinical breast exam.** / Every year after age 74  years.  BRCA-related cancer risk assessment.** / For women who have family members with a BRCA-related cancer (breast, ovarian, tubal, or peritoneal cancers).  Mammogram.** / Every year beginning at age 77 years and continuing for as long as you are in good health. Consult with your health care provider.  Pap test.** / Every 3 years starting at age 20 years through age 80 or 11 years with a history of 3 consecutive normal Pap tests.  HPV screening.** / Every 3 years from ages 66 years through ages 39 to 74 years with a history of 3 consecutive normal Pap tests.  Fecal occult blood test (FOBT) of stool. / Every year beginning at age 77 years and continuing until age 35 years. You may not need to do this test if you get a colonoscopy every 10 years.  Flexible sigmoidoscopy or colonoscopy.** / Every 5 years for a flexible sigmoidoscopy or every 10 years for a colonoscopy beginning at age 64 years and continuing until age 30 years.  Hepatitis C blood test.** / For all people born from [birth year] through 1966 and any individual with known risks for hepatitis C.  Skin self-exam. / Monthly.  Influenza vaccine. / Every year.  Tetanus, diphtheria, and acellular pertussis (Tdap/Td) vaccine.** / Consult your health care provider. Pregnant women should receive 1 dose of Tdap vaccine during each pregnancy. 1 dose of Td every 10 years.  Varicella vaccine.** / Consult your health care provider. Pregnant females who do not have evidence of immunity should receive the first dose after pregnancy.  Zoster vaccine.** / 1 dose for adults aged 63 years or older.  Measles, mumps, rubella (MMR) vaccine.** / You need at least 1 dose of MMR if you were born in 1957 or later. You may also need a 2nd dose. For females of childbearing age, rubella immunity should be determined. If there is no evidence of immunity, females who are not pregnant should be vaccinated. If there is no evidence of immunity, females who are  pregnant should delay immunization until after pregnancy.  Pneumococcal 13-valent conjugate (PCV13) vaccine.** / Consult your health care provider.  Pneumococcal polysaccharide (PPSV23) vaccine.** / 1 to 2 doses if you smoke cigarettes or if you have certain conditions.  Meningococcal vaccine.** / Consult your health care provider.  Hepatitis A vaccine.** / Consult your health care provider.  Hepatitis B vaccine.** / Consult your health care provider.  Haemophilus influenzae type b (Hib) vaccine.** / Consult your health care provider.  Ages 40 years and over  Blood pressure check.** / Every 1 to 2 years.  Lipid and cholesterol check.** / Every 5 years beginning at age 71 years.  Lung cancer screening. / Every year if you are aged 22 80 years and have  a 30-pack-year history of smoking and currently smoke or have quit within the past 15 years. Yearly screening is stopped once you have quit smoking for at least 15 years or develop a health problem that would prevent you from having lung cancer treatment.  Clinical breast exam.** / Every year after age 60 years.  BRCA-related cancer risk assessment.** / For women who have family members with a BRCA-related cancer (breast, ovarian, tubal, or peritoneal cancers).  Mammogram.** / Every year beginning at age 47 years and continuing for as long as you are in good health. Consult with your health care provider.  Pap test.** / Every 3 years starting at age 77 years through age 63 or 28 years with 3 consecutive normal Pap tests. Testing can be stopped between 65 and 70 years with 3 consecutive normal Pap tests and no abnormal Pap or HPV tests in the past 10 years.  HPV screening.** / Every 3 years from ages 42 years through ages 78 or 74 years with a history of 3 consecutive normal Pap tests. Testing can be stopped between 65 and 70 years with 3 consecutive normal Pap tests and no abnormal Pap or HPV tests in the past 10 years.  Fecal occult  blood test (FOBT) of stool. / Every year beginning at age 49 years and continuing until age 47 years. You may not need to do this test if you get a colonoscopy every 10 years.  Flexible sigmoidoscopy or colonoscopy.** / Every 5 years for a flexible sigmoidoscopy or every 10 years for a colonoscopy beginning at age 24 years and continuing until age 71 years.  Hepatitis C blood test.** / For all people born from [birth year] through 1966 and any individual with known risks for hepatitis C.  Osteoporosis screening.** / A one-time screening for women ages 36 years and over and women at risk for fractures or osteoporosis.  Skin self-exam. / Monthly.  Influenza vaccine. / Every year.  Tetanus, diphtheria, and acellular pertussis (Tdap/Td) vaccine.** / 1 dose of Td every 10 years.  Varicella vaccine.** / Consult your health care provider.  Zoster vaccine.** / 1 dose for adults aged 2 years or older.  Pneumococcal 13-valent conjugate (PCV13) vaccine.** / Consult your health care provider.  Pneumococcal polysaccharide (PPSV23) vaccine.** / 1 dose for all adults aged 79 years and older.  Meningococcal vaccine.** / Consult your health care provider.  Hepatitis A vaccine.** / Consult your health care provider.  Hepatitis B vaccine.** / Consult your health care provider.  Haemophilus influenzae type b (Hib) vaccine.** / Consult your health care provider. ** Family history and personal history of risk and conditions may change your health care provider's recommendations. Document Released: 02/14/2001 Document Revised: 10/09/2012  [facility] Patient Information 2014 [place], [place].   EXERCISE AND DIET:  We recommended that you start or continue a regular exercise program for good health. Regular exercise means any activity that makes your heart beat faster and makes you sweat.  We recommend exercising at least 30 minutes per day at least 3 days a week, preferably 5.  We also recommend a diet low in fat  and sugar / carbohydrates.  Inactivity, poor dietary choices and obesity can cause diabetes, heart attack, stroke, and kidney damage, among others.     ALCOHOL AND SMOKING:  Women should limit their alcohol intake to no more than 7 drinks/beers/glasses of wine (combined, not each!) per week. Moderation of alcohol intake to this level decreases your risk of breast cancer and liver damage.  (  And of course, no recreational drugs are part of a healthy lifestyle.)  Also, you should not be smoking at all or even being exposed to second hand smoke. Most people know smoking can cause cancer, and various heart and lung diseases, but did you know it also contributes to weakening of your bones?  Aging of your skin?  Yellowing of your teeth and nails?   CALCIUM AND VITAMIN D:  Adequate intake of calcium and Vitamin D are recommended.  The recommendations for exact amounts of these supplements seem to change often, but generally speaking 600 mg of calcium (either carbonate or citrate) and 800 units of Vitamin D per day seems prudent. Certain women may benefit from higher intake of Vitamin D.  If you are among these women, your doctor will have told you during your visit.     PAP SMEARS:  Pap smears, to check for cervical cancer or precancers,  have traditionally been done yearly, although recent scientific advances have shown that most women can have pap smears less often.  However, every woman still should have a physical exam from her gynecologist or primary care physician every year. It will include a breast check, inspection of the vulva and vagina to check for abnormal growths or skin changes, a visual exam of the cervix, and then an exam to evaluate the size and shape of the uterus and ovaries.  And after 59 years of age, a rectal exam is indicated to check for rectal cancers. We will also provide age appropriate advice regarding health maintenance, like when you should have certain vaccines, screening for  sexually transmitted diseases, bone density testing, colonoscopy, mammograms, etc.    MAMMOGRAMS:  All women over 58 years old should have a yearly mammogram. Many facilities now offer a "3D" mammogram, which may cost around $50 extra out of pocket. If possible,  we recommend you accept the option to have the 3D mammogram performed.  It both reduces the number of women who will be called back for extra views which then turn out to be normal, and it is better than the routine mammogram at detecting truly abnormal areas.     COLONOSCOPY:  Colonoscopy to screen for colon cancer is recommended for all women at age 33.  We know, you hate the idea of the prep.  We agree, BUT, having colon cancer and not knowing it is worse!!  Colon cancer so often starts as a polyp that can be seen and removed at colonscopy, which can quite literally save your life!  And if your first colonoscopy is normal and you have no family history of colon cancer, most women don't have to have it again for 10 years.  Once every ten years, you can do something that may end up saving your life, right?  We will be happy to help you get it scheduled when you are ready.  Be sure to check your insurance coverage so you understand how much it will cost.  It may be covered as a preventative service at no cost, but you should check your particular policy.

## 2018-12-31 NOTE — Progress Notes (Signed)
Impression and Recommendations:    1. Encounter for wellness examination   2. Health education/counseling   3. White coat syndrome without diagnosis of hypertension   4. Vitamin D deficiency      1) Anticipatory Guidance: Discussed importance of wearing a seatbelt while driving, not texting while driving; sunscreen when outside along with yearly skin surveillance; eating a well balanced and modest diet; physical activity at least 25 minutes per day or 150 min/ week of moderate to intense activity.  - Given her white coat syndrome, encouraged patient to continue checking her BP regularly at home.  - Prudent home skin screening habits discussed with patient today.  - Prudent monthly self-breast screening habits discussed with patient today.  2) Immunizations / Screenings / Labs:   All immunizations and screenings that patient agrees to, are up-to-date per recommendations or will be updated today.  Patient understands the needs for q 66mo dental and yearly vision screens which pt will schedule independently. Obtain CBC, CMP, HgA1c, Lipid panel, TSH and vit D when fasting if not already done recently.   - Education provided to patient today regarding need for appropriate screenings and immunizations.  - Mammogram obtained in Feb 2020, up to date.  - Last obtained 12/20/2017.  Repeat colonoscopy in 10 years.  - Need for shingles vaccination.  Encouraged patient to call her insurance to ask about coverage.  - Last obtained pap smear last CPE, 12 of 2019.  Was WNL, negative HPV.  Discussed repeat in 3-5 years.  - Discussed use of stool cards between the years of colonoscopy.  3) Health Counseling - BMI WNL  - Discussed goal of improving nutrient density of diet through increasing intake of fruits and vegetables and decreasing saturated/trans fats, white flour products and refined sugar products.   - Advised patient to continue working toward exercising to improve overall mental,  physical, and emotional health.    - Encouraged patient to engage in weight-bearing exercise to improve and preserve the strength of her bones.  - Reviewed the "spokes of the wheel" of mood and health management.  Stressed the importance of ongoing prudent habits, including regular exercise, appropriate sleep hygiene, healthful dietary habits, and prayer/meditation to relax.  - Encouraged patient to engage in daily physical activity, especially a formal exercise routine.  Recommended that the patient eventually strive for at least 150 minutes of moderate cardiovascular activity per week according to guidelines established by the [facility].   - Healthy dietary habits encouraged, including low-carb, and high amounts of lean protein in diet.   - Patient should also consume adequate amounts of water.  - Health counseling performed.  All questions answered.  Recommendations - Return as-needed for concerns and chronic f/up. - Continue to check blood pressure at home, follow-up sooner than planned if not at goal.   Return for f/up 6 months.   Gross side effects, risk and benefits, and alternatives of medications discussed with patient.  Patient is aware that all medications have potential side effects and we are unable to predict every side effect or drug-drug interaction that may occur.  Expresses verbal understanding and consents to current therapy plan and treatment regimen.  F-up preventative CPE in 1 year- reminded pt again, this is in addition to any chronic care visits.    Please see orders placed and AVS handed out to patient at the end of our visit for further patient instructions/ counseling done pertaining to today's office visit.   This document serves as  a record of services personally performed by Nicole Loteborah Donalyn Schneeberger, DO. It was created on her behalf by Nicole Hawkins, a trained medical scribe. The creation of this record is based on the scribe's personal observations and the provider's  statements to them.   This case required medical decision making of at least moderate complexity. The above documentation has been reviewed to be accurate and was completed by Nicole Hawkins, D.O.     Subjective:    I, Nicole Hawkins, am serving as scribe for Dr. Thomasene Loteborah Kadia Hawkins.  Chief Complaint  Patient presents with  . Annual Exam   CC:   HPI: Nicole Hawkins is a 58 y.o. female who [facility] a yearly health maintenance exam.  Health Maintenance Summary Reviewed and updated, unless pt declines services.  Colonoscopy:  Last obtained 12/20/2017. Tobacco History Reviewed:   Y; never smoker. Alcohol:    No concerns, no excessive use Exercise Habits:  She continues walking and moving all day at her job, "probably more than 2-3 miles."  In terms of purposeful exercise, she believes she hits about 30 minutes per day. STD concerns:   none Drug Use:   None Birth control method:   n/a Menses regular:     n/a Lumps or breast concerns:      no Breast Cancer Family History:  Yes.  Patient notes she performs self-breast screening at home, but not every month. Bone/ DEXA scan:  Does not have a strong family history of osteopenia or osteoporosis that she knows of.  Has never been told she has thinning of the bones.  Has never been managed on chronic steroids.  Has never had an unexplained bone break or fracture.  Notes that her blood pressure is never high at home.  She uses an arm cuff to measure, and runs 130's/80's on average.  - Eye Health Has her eyes checked about once yearly.  - Dental Health Believes she last went to the dentist two years ago.  - Dermatological Health Denies concerning changes to her skin.  Denies constipation, diarrhea.  Denies concerns with GI.  Denies SOB, chest pain, denies chest tightness, arm pain, or any unusual feelings or symptoms with activity.  Says every once in a while, every 3 moths, she  experiences a localized dull pain that lasts for a day or so and then goes away.    Immunization History  Administered Date(s) Administered  . Influenza,inj,Quad PF,6+ Mos 10/26/2017, 10/09/2018  . Influenza,inj,quad, With Preservative 01/06/2016  . Tdap 01/03/2007, 12/17/2017    Health Maintenance  Topic Date Due  . MAMMOGRAM  02/15/2020  . PAP SMEAR-Modifier  12/17/2020  . COLONOSCOPY  01/22/2024  . TETANUS/TDAP  12/18/2027  . INFLUENZA VACCINE  Completed  . Hepatitis C Screening  Completed  . HIV Screening  Completed     Wt Readings from Last 3 Encounters:  12/31/18 117 lb 4.8 oz (53.2 kg)  07/30/18 125 lb 8 oz (56.9 kg)  03/14/18 123 lb 11.2 oz (56.1 kg)   BP Readings from Last 3 Encounters:  12/31/18 (!) 162/84  07/30/18 (!) 148/78  03/14/18 136/83   Pulse Readings from Last 3 Encounters:  12/31/18 86  07/30/18 86  03/14/18 86     History reviewed. No pertinent past medical history.    Past Surgical History:  Procedure Laterality Date  . BREAST BIOPSY Right    cyst  . DILATION AND CURETTAGE OF UTERUS  Family History  Problem Relation Age of Onset  . Diabetes Mother   . Hypertension Mother   . Stroke Mother   . Hypertension Father   . High Cholesterol Sister       Social History   Substance and Sexual Activity  Drug Use Never  ,   Social History   Substance and Sexual Activity  Alcohol Use Never  ,   Social History   Tobacco Use  Smoking Status Never Smoker  Smokeless Tobacco Never Used  ,   Social History   Substance and Sexual Activity  Sexual Activity Yes  . Partners: Male  . Birth control/protection: None    No current outpatient medications on file prior to visit.   No current facility-administered medications on file prior to visit.    Allergies: Patient has no known allergies.  Review of Systems: General:   Denies fever, chills, unexplained weight loss.  Optho/Auditory:   Denies visual changes,  blurred vision/LOV Respiratory:   Denies SOB, DOE more than baseline levels.   Cardiovascular:   Denies chest pain, palpitations, new onset peripheral edema  Gastrointestinal:   Denies nausea, vomiting, diarrhea.  Genitourinary: Denies dysuria, freq/ urgency, flank pain or discharge from genitals.  Endocrine:     Denies hot or cold intolerance, polyuria, polydipsia. Musculoskeletal:   Denies unexplained myalgias, joint swelling, unexplained arthralgias, gait problems.  Skin:  Denies rash, suspicious lesions Neurological:     Denies dizziness, unexplained weakness, numbness  Psychiatric/Behavioral:   Denies mood changes, suicidal or homicidal ideations, hallucinations    Objective:    Blood pressure (!) 162/84, pulse 86, temperature 97.8 F (36.6 C), temperature source Oral, resp. rate 14, height 5\' 3"  (1.6 m), weight 117 lb 4.8 oz (53.2 kg), SpO2 99 %. Body mass index is 20.78 kg/m. General Appearance:    Alert, cooperative, no distress, appears stated age  Head:    Normocephalic, without obvious abnormality, atraumatic  Eyes:    PERRL, conjunctiva/corneas clear, EOM's intact, fundi    benign, both eyes  Ears:    Normal TM's and external ear canals, both ears  Nose:   Nares normal, septum midline, mucosa normal, no drainage    or sinus tenderness  Throat:   Lips w/o lesion, mucosa moist, and tongue normal; teeth and   gums normal  Neck:   Supple, symmetrical, trachea midline, no adenopathy;    thyroid:  no enlargement/tenderness/nodules; no carotid   bruit or JVD  Back:     Symmetric, no curvature, ROM normal, no CVA tenderness  Lungs:     Clear to auscultation bilaterally, respirations unlabored, no       Wh/ R/ R  Chest Wall:    No tenderness or gross deformity; normal excursion   Heart:    Regular rate and rhythm, S1 and S2 normal, no murmur, rub   or gallop  Breast Exam:     No tenderness, masses, or nipple abnormality b/l; no d/c  Abdomen:     Soft, non-tender, bowel sounds  active all four quadrants, NO   G/R/R, no masses, no organomegaly  Genitalia:    Deferred.  Rectal:    Patient declines rectal exam.  Stool cards provided for patient's use at home.  Extremities:   Extremities normal, atraumatic, no cyanosis or gross edema  Pulses:   2+ and symmetric all extremities  Skin:   Warm, dry, Skin color, texture, turgor normal, no obvious rashes or lesions Psych: No HI/SI, judgement and insight good, Euthymic mood.  Full Affect.  Neurologic:   CNII-XII intact, normal strength, sensation and reflexes    Throughout

## 2019-01-01 LAB — CBC WITH DIFFERENTIAL/PLATELET
Basophils Absolute: 0 10*3/uL (ref 0.0–0.2)
Basos: 1 %
EOS (ABSOLUTE): 0.2 10*3/uL (ref 0.0–0.4)
Eos: 4 %
Hematocrit: 40.6 % (ref 34.0–46.6)
Hemoglobin: 13.3 g/dL (ref 11.1–15.9)
Immature Grans (Abs): 0 10*3/uL (ref 0.0–0.1)
Immature Granulocytes: 0 %
Lymphocytes Absolute: 2.5 10*3/uL (ref 0.7–3.1)
Lymphs: 42 %
MCH: 28.1 pg (ref 26.6–33.0)
MCHC: 32.8 g/dL (ref 31.5–35.7)
MCV: 86 fL (ref 79–97)
Monocytes Absolute: 0.5 10*3/uL (ref 0.1–0.9)
Monocytes: 8 %
Neutrophils Absolute: 2.8 10*3/uL (ref 1.4–7.0)
Neutrophils: 45 %
Platelets: 255 10*3/uL (ref 150–450)
RBC: 4.74 x10E6/uL (ref 3.77–5.28)
RDW: 12.3 % (ref 11.7–15.4)
WBC: 6 10*3/uL (ref 3.4–10.8)

## 2019-01-01 LAB — T3: T3, Total: 99 ng/dL (ref 71–180)

## 2019-01-01 LAB — COMPREHENSIVE METABOLIC PANEL
ALT: 15 IU/L (ref 0–32)
AST: 19 IU/L (ref 0–40)
Albumin/Globulin Ratio: 1.7 (ref 1.2–2.2)
Albumin: 4.6 g/dL (ref 3.8–4.9)
Alkaline Phosphatase: 91 IU/L (ref 39–117)
BUN/Creatinine Ratio: 16 (ref 9–23)
BUN: 14 mg/dL (ref 6–24)
Bilirubin Total: 0.4 mg/dL (ref 0.0–1.2)
CO2: 25 mmol/L (ref 20–29)
Calcium: 9.7 mg/dL (ref 8.7–10.2)
Chloride: 101 mmol/L (ref 96–106)
Creatinine, Ser: 0.88 mg/dL (ref 0.57–1.00)
GFR calc Af Amer: 83 mL/min/{1.73_m2} (ref >59)
GFR calc non Af Amer: 72 mL/min/{1.73_m2} (ref >59)
Globulin, Total: 2.7 g/dL (ref 1.5–4.5)
Glucose: 96 mg/dL (ref 65–99)
Potassium: 4.9 mmol/L (ref 3.5–5.2)
Sodium: 139 mmol/L (ref 134–144)
Total Protein: 7.3 g/dL (ref 6.0–8.5)

## 2019-01-01 LAB — LIPID PANEL
Chol/HDL Ratio: 4.1 ratio (ref 0.0–4.4)
Cholesterol, Total: 173 mg/dL (ref 100–199)
HDL: 42 mg/dL (ref >39)
LDL Chol Calc (NIH): 114 mg/dL — ABNORMAL HIGH (ref 0–99)
Triglycerides: 91 mg/dL (ref 0–149)
VLDL Cholesterol Cal: 17 mg/dL (ref 5–40)

## 2019-01-01 LAB — TSH: TSH: 2.37 u[IU]/mL (ref 0.450–4.500)

## 2019-01-01 LAB — HEMOGLOBIN A1C
Est. average glucose Bld gHb Est-mCnc: 111 mg/dL
Hgb A1c MFr Bld: 5.5 % (ref 4.8–5.6)

## 2019-01-01 LAB — VITAMIN D 25 HYDROXY (VIT D DEFICIENCY, FRACTURES): Vit D, 25-Hydroxy: 69.6 ng/mL (ref 30.0–100.0)

## 2019-01-01 LAB — T4, FREE: Free T4: 1.03 ng/dL (ref 0.82–1.77)

## 2019-01-06 ENCOUNTER — Telehealth: Payer: Self-pay | Admitting: Family Medicine

## 2019-01-06 DIAGNOSIS — E559 Vitamin D deficiency, unspecified: Secondary | ICD-10-CM

## 2019-01-06 MED ORDER — VITAMIN D (ERGOCALCIFEROL) 1.25 MG (50000 UNIT) PO CAPS: ORAL_CAPSULE | ORAL | 3 refills | Status: DC

## 2019-01-06 NOTE — Telephone Encounter (Signed)
Please let patient know med sent to pharmacy. AS, CMA 

## 2019-01-06 NOTE — Telephone Encounter (Signed)
Patient is requesting a refill of her Vit D, if approved please send to [facility] on [address]

## 2019-01-06 NOTE — Addendum Note (Signed)
Addended by: Sylvester Harder on: 01/06/2019 03:40 PM   Modules accepted: Orders

## 2019-01-21 ENCOUNTER — Other Ambulatory Visit: Payer: Self-pay | Admitting: Family Medicine

## 2019-01-21 DIAGNOSIS — Z1231 Encounter for screening mammogram for malignant neoplasm of breast: Secondary | ICD-10-CM

## 2019-02-19 ENCOUNTER — Ambulatory Visit
Admission: RE | Admit: 2019-02-19 | Discharge: 2019-02-19 | Disposition: A | Discharge status: Home / Self Care | Payer: 59 | Source: Ambulatory Visit | Attending: Family Medicine | Admitting: Family Medicine

## 2019-02-19 ENCOUNTER — Other Ambulatory Visit: Payer: Self-pay

## 2019-02-19 DIAGNOSIS — Z1231 Encounter for screening mammogram for malignant neoplasm of breast: Secondary | ICD-10-CM

## 2019-03-10 ENCOUNTER — Other Ambulatory Visit: Payer: Self-pay

## 2019-03-10 ENCOUNTER — Other Ambulatory Visit (INDEPENDENT_AMBULATORY_CARE_PROVIDER_SITE_OTHER): Payer: 59

## 2019-03-10 DIAGNOSIS — Z1211 Encounter for screening for malignant neoplasm of colon: Secondary | ICD-10-CM | POA: Diagnosis not present

## 2019-03-10 LAB — IFOBT (OCCULT BLOOD)
IFOBT: NEGATIVE
IFOBT: NEGATIVE
IFOBT: NEGATIVE

## 2019-03-10 NOTE — Progress Notes (Signed)
Patient mailed in IFOB to office for dates 03/03/19, 03/04/19, 03/05/19. AS, CMA

## 2019-12-03 ENCOUNTER — Telehealth: Payer: Self-pay | Admitting: Physician Assistant

## 2019-12-03 DIAGNOSIS — E559 Vitamin D deficiency, unspecified: Secondary | ICD-10-CM

## 2019-12-03 MED ORDER — VITAMIN D (ERGOCALCIFEROL) 1.25 MG (50000 UNIT) PO CAPS: ORAL_CAPSULE | ORAL | 0 refills | Status: DC

## 2019-12-03 NOTE — Telephone Encounter (Signed)
Patient's husband called stating patient needs a refill on Vitamen D and the pharmacy is [facility] on [address]. Thanks

## 2019-12-03 NOTE — Telephone Encounter (Signed)
Informed pt's spouse that pt needs OV prior to any further refills.  Mr. Komar was transferred to front desk to schedule appt.  Tiajuana Amass, CMA

## 2019-12-03 NOTE — Addendum Note (Signed)
Addended by: Stan Head on: 12/03/2019 09:58 AM   Modules accepted: Orders

## 2020-01-14 ENCOUNTER — Ambulatory Visit (INDEPENDENT_AMBULATORY_CARE_PROVIDER_SITE_OTHER): Payer: 59 | Admitting: Physician Assistant

## 2020-01-14 ENCOUNTER — Other Ambulatory Visit: Payer: Self-pay

## 2020-01-14 ENCOUNTER — Ambulatory Visit: Payer: 59 | Admitting: Physician Assistant

## 2020-01-14 ENCOUNTER — Encounter: Payer: Self-pay | Admitting: Physician Assistant

## 2020-01-14 VITALS — BP 127/67 | HR 67 | Ht 62.0 in | Wt 118.0 lb

## 2020-01-14 DIAGNOSIS — R030 Elevated blood-pressure reading, without diagnosis of hypertension: Secondary | ICD-10-CM

## 2020-01-14 DIAGNOSIS — E559 Vitamin D deficiency, unspecified: Secondary | ICD-10-CM

## 2020-01-14 NOTE — Progress Notes (Signed)
Telehealth office visit note for Nicole Masker, PA-C- at Primary Care at [facility]   I connected with current patient today by telephone and verified that I am speaking with the correct person   . Location of the patient: Home . Location of the provider: Office - This visit type was conducted due to national recommendations for restrictions regarding the COVID-19 Pandemic (e.g. social distancing) in an effort to limit this patient's exposure and mitigate transmission in our community.    - No physical exam could be performed with this format, beyond that communicated to [place] by the patient/ family members as noted.   - Additionally my office staff/ schedulers were to discuss with the patient that there may be a monetary charge related to this service, depending on their medical insurance.  My understanding is that patient understood and consented to proceed.     _________________________________________________________________________________   History of Present Illness: Patient calls in to for medication refills on Vitamin D 50,000 units. Has no acute concerns today.  Patient has a history of white coat syndrome without diagnosis of hypertension.  Patient checks blood pressure at home and readings average 120-130s/60-70s.  Denies chest pain, palpitations,, vision changes or lower extremity swelling.     No flowsheet data found.  Depression screen [facility] 2/9 01/14/2020 12/31/2018 07/30/2018 01/22/2018 12/17/2017  Decreased Interest 0 0 0 0 0  Down, Depressed, Hopeless 0 0 0 0 0  PHQ - 2 Score 0 0 0 0 0  Altered sleeping 1 0 0 - 0  Tired, decreased energy 0 0 0 0 0  Change in appetite 0 0 0 0 0  Feeling bad or failure about yourself  0 0 0 0 0  Trouble concentrating 0 0 0 0 0  Moving slowly or fidgety/restless 0 0 0 0 0  Suicidal thoughts 0 0 0 0 0  PHQ-9 Score 1 0 0 - 0  Difficult doing work/chores Not difficult at all - - Not difficult at all Not difficult at all       Impression and Recommendations:     1. Vitamin D deficiency   2. White coat syndrome without diagnosis of hypertension     Vitamin D deficiency: -Last vitamin D 69.6 -Advised patient to schedule lab visit for fasting blood work including vitamin D. Pending results we will send additional refills if indicated. -Will continue to monitor.  White coat syndrome without diagnosis of hypertension: -Ambulatory BP readings are within normal limits.  Patient is asymptomatic. -Will continue to monitor.    - As part of my medical decision making, I reviewed the following data within the electronic MEDICAL RECORD NUMBER History obtained from pt /family, CMA notes reviewed and incorporated if applicable, Labs reviewed, Radiograph/ tests reviewed if applicable and OV notes from prior OV's with me, as well as any other specialists she/he has seen since seeing me last, were all reviewed and used in my medical decision making process today.    - Additionally, when appropriate, discussion had with patient regarding our treatment plan, and their biases/concerns about that plan were used in my medical decision making today.    - The patient agreed with the plan and demonstrated an understanding of the instructions.   No barriers to understanding were identified.     - The patient was advised to call back or seek an in-person evaluation if the symptoms worsen or if the condition fails to improve as anticipated.   Return in  about 4 months (around 05/13/2020) for [facility]; lab visit this week or next for FBW including Vit D.    No orders of the defined types were placed in this encounter.   No orders of the defined types were placed in this encounter.   There are no discontinued medications.     Time spent on visit including pre-visit chart review and post-visit care was 8 minutes.      The 21st Century Cures Act was signed into law in 2016 which includes the topic of electronic health records.   This provides immediate access to information in MyChart.  This includes consultation notes, operative notes, office notes, lab results and pathology reports.  If you have any questions about what you read please let us know at your next visit or call us at the office.  We are right here with you.  Note:  This note was prepared with assistance of Dragon voice recognition software. Occasional wrong-word or sound-a-like substitutions may have occurred due to the inherent limitations of voice recognition software.  __________________________________________________________________________________     Patient Care Team    Relationship Specialty Notifications Start End  Nicole Hawkins, [place] PCP - General   05/04/19      -Vitals obtained; medications/ allergies reconciled;  personal medical, social, Sx etc.histories were updated by CMA, reviewed by me and are reflected in chart   Patient Active Problem List   Diagnosis Date Noted  . Hyperlipidemia 01/22/2018  . Health education/counseling 12/17/2017  . Vitamin D deficiency 06/26/2017  . Family history of diabetes mellitus (DM)-first-degree relative 06/26/2017  . Family history of breast cancer 06/26/2017  . Family history of essential hypertension-in first-degree relative 06/26/2017  . Family history of hyperlipidemia 06/26/2017  . History of postpartum depression 06/26/2017  . History of prediabetes 06/26/2017  . Family history of depression 06/26/2017  . Epistaxis, recurrent 03/17/2016     Current Meds  Medication Sig  . Vitamin D, Ergocalciferol, (DRISDOL) 1.25 MG (50000 UNIT) CAPS capsule 1 tab Every SUNDAY     Allergies:  No Known Allergies   ROS:  See above HPI for pertinent positives and negatives   Objective:   Blood pressure 127/67, pulse 67, height 5\' 2"  (1.575 m), weight 118 lb (53.5 kg).  (if some vitals are omitted, this means that patient was UNABLE to obtain them even though they were asked to get them prior  to OV today.  They were asked to call at their earliest convenience with these once obtained.) General: A & O * 3; sounds in no acute distress Respiratory: speaking in full sentences, no conversational dyspnea Psych: insight appears good, mood- appears full

## 2020-01-16 ENCOUNTER — Other Ambulatory Visit: Payer: Self-pay | Admitting: Physician Assistant

## 2020-01-16 DIAGNOSIS — Z0000 Encounter for general adult medical examination without abnormal findings: Secondary | ICD-10-CM

## 2020-01-16 DIAGNOSIS — E785 Hyperlipidemia, unspecified: Secondary | ICD-10-CM

## 2020-01-16 DIAGNOSIS — Z87898 Personal history of other specified conditions: Secondary | ICD-10-CM

## 2020-01-16 DIAGNOSIS — E559 Vitamin D deficiency, unspecified: Secondary | ICD-10-CM

## 2020-01-19 ENCOUNTER — Other Ambulatory Visit: Payer: 59

## 2020-01-20 ENCOUNTER — Other Ambulatory Visit: Payer: 59

## 2020-01-21 ENCOUNTER — Other Ambulatory Visit: Payer: 59

## 2020-01-21 ENCOUNTER — Other Ambulatory Visit: Payer: Self-pay

## 2020-01-21 DIAGNOSIS — Z0000 Encounter for general adult medical examination without abnormal findings: Secondary | ICD-10-CM

## 2020-01-21 DIAGNOSIS — E559 Vitamin D deficiency, unspecified: Secondary | ICD-10-CM

## 2020-01-21 DIAGNOSIS — E785 Hyperlipidemia, unspecified: Secondary | ICD-10-CM

## 2020-01-21 DIAGNOSIS — Z87898 Personal history of other specified conditions: Secondary | ICD-10-CM

## 2020-01-22 LAB — LIPID PANEL
Chol/HDL Ratio: 4 ratio (ref 0.0–4.4)
Cholesterol, Total: 182 mg/dL (ref 100–199)
HDL: 46 mg/dL (ref >39)
LDL Chol Calc (NIH): 122 mg/dL — ABNORMAL HIGH (ref 0–99)
Triglycerides: 75 mg/dL (ref 0–149)
VLDL Cholesterol Cal: 14 mg/dL (ref 5–40)

## 2020-01-22 LAB — COMPREHENSIVE METABOLIC PANEL
ALT: 28 IU/L (ref 0–32)
AST: 25 IU/L (ref 0–40)
Albumin/Globulin Ratio: 1.6 (ref 1.2–2.2)
Albumin: 4.9 g/dL (ref 3.8–4.9)
Alkaline Phosphatase: 121 IU/L (ref 44–121)
BUN/Creatinine Ratio: 16 (ref 12–28)
BUN: 14 mg/dL (ref 8–27)
Bilirubin Total: 0.4 mg/dL (ref 0.0–1.2)
CO2: 27 mmol/L (ref 20–29)
Calcium: 10.3 mg/dL (ref 8.7–10.3)
Chloride: 98 mmol/L (ref 96–106)
Creatinine, Ser: 0.9 mg/dL (ref 0.57–1.00)
GFR calc Af Amer: 80 mL/min/{1.73_m2} (ref >59)
GFR calc non Af Amer: 70 mL/min/{1.73_m2} (ref >59)
Globulin, Total: 3 g/dL (ref 1.5–4.5)
Glucose: 90 mg/dL (ref 65–99)
Potassium: 5 mmol/L (ref 3.5–5.2)
Sodium: 137 mmol/L (ref 134–144)
Total Protein: 7.9 g/dL (ref 6.0–8.5)

## 2020-01-22 LAB — CBC
Hematocrit: 46.2 % (ref 34.0–46.6)
Hemoglobin: 14.7 g/dL (ref 11.1–15.9)
MCH: 27.6 pg (ref 26.6–33.0)
MCHC: 31.8 g/dL (ref 31.5–35.7)
MCV: 87 fL (ref 79–97)
Platelets: 222 10*3/uL (ref 150–450)
RBC: 5.33 x10E6/uL — ABNORMAL HIGH (ref 3.77–5.28)
RDW: 12 % (ref 11.7–15.4)
WBC: 6.3 10*3/uL (ref 3.4–10.8)

## 2020-01-22 LAB — HEMOGLOBIN A1C
Est. average glucose Bld gHb Est-mCnc: 114 mg/dL
Hgb A1c MFr Bld: 5.6 % (ref 4.8–5.6)

## 2020-01-22 LAB — TSH: TSH: 1.71 u[IU]/mL (ref 0.450–4.500)

## 2020-01-22 LAB — VITAMIN D 25 HYDROXY (VIT D DEFICIENCY, FRACTURES): Vit D, 25-Hydroxy: 77.1 ng/mL (ref 30.0–100.0)

## 2020-01-29 ENCOUNTER — Other Ambulatory Visit: Payer: Self-pay | Admitting: Family Medicine

## 2020-01-29 DIAGNOSIS — Z1231 Encounter for screening mammogram for malignant neoplasm of breast: Secondary | ICD-10-CM

## 2020-02-12 ENCOUNTER — Encounter: Payer: Self-pay | Admitting: Physician Assistant

## 2020-02-12 ENCOUNTER — Ambulatory Visit: Payer: 59 | Admitting: Physician Assistant

## 2020-02-12 NOTE — Progress Notes (Signed)
Patient scheduled for telehealth [facility] and should be CPE in office. Call transferred to front desk to reschedule since patient can not be here for CPE today. AS, CMA

## 2020-02-20 ENCOUNTER — Ambulatory Visit
Admission: RE | Admit: 2020-02-20 | Discharge: 2020-02-20 | Disposition: A | Discharge status: Home / Self Care | Payer: 59 | Source: Ambulatory Visit

## 2020-02-20 ENCOUNTER — Other Ambulatory Visit: Payer: Self-pay

## 2020-02-20 DIAGNOSIS — Z1231 Encounter for screening mammogram for malignant neoplasm of breast: Secondary | ICD-10-CM

## 2020-03-11 ENCOUNTER — Other Ambulatory Visit: Payer: Self-pay

## 2020-03-11 ENCOUNTER — Ambulatory Visit (INDEPENDENT_AMBULATORY_CARE_PROVIDER_SITE_OTHER): Payer: 59 | Admitting: Physician Assistant

## 2020-03-11 ENCOUNTER — Encounter: Payer: Self-pay | Admitting: Physician Assistant

## 2020-03-11 VITALS — BP 131/76 | HR 88 | Temp 97.8°F | Ht 62.0 in | Wt 118.9 lb

## 2020-03-11 DIAGNOSIS — Z0000 Encounter for general adult medical examination without abnormal findings: Secondary | ICD-10-CM

## 2020-03-11 DIAGNOSIS — E559 Vitamin D deficiency, unspecified: Secondary | ICD-10-CM

## 2020-03-11 DIAGNOSIS — E785 Hyperlipidemia, unspecified: Secondary | ICD-10-CM

## 2020-03-11 NOTE — Progress Notes (Signed)
Subjective:     Nicole Hawkins is a 60 y.o. female and is here for a comprehensive physical exam. The patient reports no problems.  Social History   Socioeconomic History  . Marital status: Married    Spouse name: Not on file  . Number of children: Not on file  . Years of education: Not on file  . Highest education level: Not on file  Occupational History  . Not on file  Tobacco Use  . Smoking status: Never Smoker  . Smokeless tobacco: Never Used  Vaping Use  . Vaping Use: Never used  Substance and Sexual Activity  . Alcohol use: Never  . Drug use: Never  . Sexual activity: Yes    Partners: Male    Birth control/protection: None  Other Topics Concern  . Not on file  Social History Narrative  . Not on file   Social Determinants of Health   Financial Resource Strain: Not on file  Food Insecurity: Not on file  Transportation Needs: Not on file  Physical Activity: Not on file  Stress: Not on file  Social Connections: Not on file  Intimate Partner Violence: Not on file   Health Maintenance  Topic Date Due  . COVID-19 Vaccine (1) Never done  . INFLUENZA VACCINE  08/03/2019  . PAP SMEAR-Modifier  12/17/2020  . MAMMOGRAM  02/19/2022  . COLONOSCOPY (Pts 45-77yrs Insurance coverage will need to be confirmed)  01/22/2024  . TETANUS/TDAP  12/18/2027  . Hepatitis C Screening  Completed  . HIV Screening  Completed  . HPV VACCINES  Aged Out    The following portions of the patient's history were reviewed and updated as appropriate: allergies, current medications, past family history, past medical history, past social history, past surgical history and problem list.  Review of Systems Pertinent items noted in HPI and remainder of comprehensive ROS otherwise negative.   Objective:    BP 131/76   Pulse 88   Temp 97.8 F (36.6 C)   Ht 5\' 2"  (1.575 m)   Wt 118 lb 14.4 oz (53.9 kg)   SpO2 100%   BMI 21.75 kg/m  General appearance: alert, cooperative and no  distress Head: Normocephalic, without obvious abnormality, atraumatic Eyes: conjunctivae/corneas clear. PERRL, EOM's intact. Fundi benign. Ears: normal TM and external ear canal left ear and abnormal external canal left ear - partial visualization due to partial cerumen obstruction Nose: Nares normal. Septum midline. Mucosa normal. No drainage or sinus tenderness. Throat: lips, mucosa, and tongue normal; teeth and gums normal Neck: mild anterior cervical adenopathy, no adenopathy, no JVD, supple, symmetrical, trachea midline and thyroid not enlarged, symmetric, no tenderness/mass/nodules Back: symmetric, no curvature. ROM normal. No CVA tenderness. Lungs: clear to auscultation bilaterally Breasts: Deferred. Recent mammogram normal. Heart: regular rate and rhythm, S1, S2 normal, no murmur, click, rub or gallop Abdomen: soft, non-tender; bowel sounds normal; no masses,  no organomegaly Pelvic: deferred Extremities: extremities normal, atraumatic, no cyanosis or edema Pulses: 2+ and symmetric Skin: Skin color, texture, turgor normal. No rashes or lesions Lymph nodes: Cervical adenopathy: mild enlargement of submandibular gland and Supraclavicular adenopathy: normal Neurologic: Grossly normal    Assessment:    Healthy female exam.      Plan:  -Discussed most recent labs which are essentially within normal limits or stable from prior with exception of lipid panel and CBC- RBC mildly elevated and will repeat in 4 weeks. LDL mildly increased from prior. Patient prefers to continue management with dietary and lifestyle changes.  Recommend to reduce saturated and trans fats, continue daily 1 hr walks (5x's/wk). -Start taking Vitamin D3 2000 units. -UTD on mammogram, pap smear, colonoscopy, Hep C and HIV screenings, Tdap, declined Shingrix (pt will check with insurance Shingrix cost). -Continue ambulatory BP monitoring. (ambulatory readings have been stable, <130/80). -Follow up in 1 year for CPE  and FBW or sooner if needed.   See After Visit Summary for Counseling Recommendations

## 2020-03-11 NOTE — Patient Instructions (Addendum)
Heart-Healthy Eating Plan Heart-healthy meal planning includes:  Eating less unhealthy fats.  Eating more healthy fats.  Making other changes in your diet. Talk with your doctor or a diet specialist (dietitian) to create an eating plan that is right for you. What is my plan? Your doctor may recommend an eating plan that includes:  Total fat: ______% or less of total calories a day.  Saturated fat: ______% or less of total calories a day.  Cholesterol: less than _________mg a day. What are tips for following this plan? Cooking Avoid frying your food. Try to bake, boil, grill, or broil it instead. You can also reduce fat by:  Removing the skin from poultry.  Removing all visible fats from meats.  Steaming vegetables in water or broth. Meal planning  At meals, divide your plate into four equal parts: ? Fill one-half of your plate with vegetables and green salads. ? Fill one-fourth of your plate with whole grains. ? Fill one-fourth of your plate with lean protein foods.  Eat 4-5 servings of vegetables per day. A serving of vegetables is: ? 1 cup of raw or cooked vegetables. ? 2 cups of raw leafy greens.  Eat 4-5 servings of fruit per day. A serving of fruit is: ? 1 medium whole fruit. ?  cup of dried fruit. ?  cup of fresh, frozen, or canned fruit. ?  cup of 100% fruit juice.  Eat more foods that have soluble fiber. These are apples, broccoli, carrots, beans, peas, and barley. Try to get 20-30 g of fiber per day.  Eat 4-5 servings of nuts, legumes, and seeds per week: ? 1 serving of dried beans or legumes equals  cup after being cooked. ? 1 serving of nuts is  cup. ? 1 serving of seeds equals 1 tablespoon.   General information  Eat more home-cooked food. Eat less restaurant, buffet, and fast food.  Limit or avoid alcohol.  Limit foods that are high in starch and sugar.  Avoid fried foods.  Lose weight if you are overweight.  Keep track of how much salt  (sodium) you eat. This is important if you have high blood pressure. Ask your doctor to tell you more about this.  Try to add vegetarian meals each week. Fats  Choose healthy fats. These include olive oil and canola oil, flaxseeds, walnuts, almonds, and seeds.  Eat more omega-3 fats. These include salmon, mackerel, sardines, tuna, flaxseed oil, and ground flaxseeds. Try to eat fish at least 2 times each week.  Check food labels. Avoid foods with trans fats or high amounts of saturated fat.  Limit saturated fats. ? These are often found in animal products, such as meats, butter, and cream. ? These are also found in plant foods, such as palm oil, palm kernel oil, and coconut oil.  Avoid foods with partially hydrogenated oils in them. These have trans fats. Examples are stick margarine, some tub margarines, cookies, crackers, and other baked goods. What foods can I eat? Fruits All fresh, canned (in natural juice), or frozen fruits. Vegetables Fresh or frozen vegetables (raw, steamed, roasted, or grilled). Green salads. Grains Most grains. Choose whole wheat and whole grains most of the time. Rice and pasta, including brown rice and pastas made with whole wheat. Meats and other proteins Lean, well-trimmed beef, veal, pork, and lamb. Chicken and turkey without skin. All fish and shellfish. Wild duck, rabbit, pheasant, and venison. Egg whites or low-cholesterol egg substitutes. Dried beans, peas, lentils, and tofu. Seeds and   most nuts. Dairy Low-fat or nonfat cheeses, including ricotta and mozzarella. Skim or 1% milk that is liquid, powdered, or evaporated. Buttermilk that is made with low-fat milk. Nonfat or low-fat yogurt. Fats and oils Non-hydrogenated (trans-free) margarines. Vegetable oils, including soybean, sesame, sunflower, olive, peanut, safflower, corn, canola, and cottonseed. Salad dressings or mayonnaise made with a vegetable oil. Beverages Mineral water. Coffee and tea. Diet  carbonated beverages. Sweets and desserts Sherbet, gelatin, and fruit ice. Small amounts of dark chocolate. Limit all sweets and desserts. Seasonings and condiments All seasonings and condiments. The items listed above may not be a complete list of foods and drinks you can eat. Contact a dietitian for more options. What foods should I avoid? Fruits Canned fruit in heavy syrup. Fruit in cream or butter sauce. Fried fruit. Limit coconut. Vegetables Vegetables cooked in cheese, cream, or butter sauce. Fried vegetables. Grains Breads that are made with saturated or trans fats, oils, or whole milk. Croissants. Sweet rolls. Donuts. High-fat crackers, such as cheese crackers. Meats and other proteins Fatty meats, such as hot dogs, ribs, sausage, bacon, rib-eye roast or steak. High-fat deli meats, such as salami and bologna. Caviar. Domestic duck and goose. Organ meats, such as liver. Dairy Cream, sour cream, cream cheese, and creamed cottage cheese. Whole-milk cheeses. Whole or 2% milk that is liquid, evaporated, or condensed. Whole buttermilk. Cream sauce or high-fat cheese sauce. Yogurt that is made from whole milk. Fats and oils Meat fat, or shortening. Cocoa butter, hydrogenated oils, palm oil, coconut oil, palm kernel oil. Solid fats and shortenings, including bacon fat, salt pork, lard, and butter. Nondairy cream substitutes. Salad dressings with cheese or sour cream. Beverages Regular sodas and juice drinks with added sugar. Sweets and desserts Frosting. Pudding. Cookies. Cakes. Pies. Milk chocolate or white chocolate. Buttered syrups. Full-fat ice cream or ice cream drinks. The items listed above may not be a complete list of foods and drinks to avoid. Contact a dietitian for more information. Summary  Heart-healthy meal planning includes eating less unhealthy fats, eating more healthy fats, and making other changes in your diet.  Eat a balanced diet. This includes fruits and  vegetables, low-fat or nonfat dairy, lean protein, nuts and legumes, whole grains, and heart-healthy oils and fats. This information is not intended to replace advice given to you by your health care provider. Make sure you discuss any questions you have with your health care provider. Document Revised: 02/22/2017 Document Reviewed: 01/26/2017 Elsevier Patient Education  2021 Elsevier Inc.   Preventive Care 76-91 Years Old, Female Preventive care refers to lifestyle choices and visits with your health care provider that can promote health and wellness. This includes:  A yearly physical exam. This is also called an annual wellness visit.  Regular dental and eye exams.  Immunizations.  Screening for certain conditions.  Healthy lifestyle choices, such as: ? Eating a healthy diet. ? Getting regular exercise. ? Not using drugs or products that contain nicotine and tobacco. ? Limiting alcohol use. What can I expect for my preventive care visit? Physical exam Your health care provider will check your:  Height and weight. These may be used to calculate your BMI (body mass index). BMI is a measurement that tells if you are at a healthy weight.  Heart rate and blood pressure.  Body temperature.  Skin for abnormal spots. Counseling Your health care provider may ask you questions about your:  Past medical problems.  Family's medical history.  Alcohol, tobacco, and drug use.  Emotional well-being.  Home life and relationship well-being.  Sexual activity.  Diet, exercise, and sleep habits.  Work and work environment.  Access to firearms.  Method of birth control.  Menstrual cycle.  Pregnancy history. What immunizations do I need? Vaccines are usually given at various ages, according to a schedule. Your health care provider will recommend vaccines for you based on your age, medical history, and lifestyle or other factors, such as travel or where you work.   What  tests do I need? Blood tests  Lipid and cholesterol levels. These may be checked every 5 years, or more often if you are over 50 years old.  Hepatitis C test.  Hepatitis B test. Screening  Lung cancer screening. You may have this screening every year starting at age 55 if you have a 30-pack-year history of smoking and currently smoke or have quit within the past 15 years.  Colorectal cancer screening. ? All adults should have this screening starting at age 50 and continuing until age 75. ? Your health care provider may recommend screening at age 45 if you are at increased risk. ? You will have tests every 1-10 years, depending on your results and the type of screening test.  Diabetes screening. ? This is done by checking your blood sugar (glucose) after you have not eaten for a while (fasting). ? You may have this done every 1-3 years.  Mammogram. ? This may be done every 1-2 years. ? Talk with your health care provider about when you should start having regular mammograms. This may depend on whether you have a family history of breast cancer.  BRCA-related cancer screening. This may be done if you have a family history of breast, ovarian, tubal, or peritoneal cancers.  Pelvic exam and Pap test. ? This may be done every 3 years starting at age 21. ? Starting at age 30, this may be done every 5 years if you have a Pap test in combination with an HPV test. Other tests  STD (sexually transmitted disease) testing, if you are at risk.  Bone density scan. This is done to screen for osteoporosis. You may have this scan if you are at high risk for osteoporosis. Talk with your health care provider about your test results, treatment options, and if necessary, the need for more tests. Follow these instructions at home: Eating and drinking  Eat a diet that includes fresh fruits and vegetables, whole grains, lean protein, and low-fat dairy products.  Take vitamin and mineral supplements  as recommended by your health care provider.  Do not drink alcohol if: ? Your health care provider tells you not to drink. ? You are pregnant, may be pregnant, or are planning to become pregnant.  If you drink alcohol: ? Limit how much you have to 0-1 drink a day. ? Be aware of how much alcohol is in your drink. In the [place] one drink equals one 12 oz bottle of beer (355 mL), one 5 oz glass of wine (148 mL), or one 1 oz glass of hard liquor (44 mL).   Lifestyle  Take daily care of your teeth and gums. Brush your teeth every morning and night with fluoride toothpaste. Floss one time each day.  Stay active. Exercise for at least 30 minutes 5 or more days each week.  Do not use any products that contain nicotine or tobacco, such as cigarettes, e-cigarettes, and chewing tobacco. If you need help quitting, ask your health care provider.  Do not   use drugs.  If you are sexually active, practice safe sex. Use a condom or other form of protection to prevent STIs (sexually transmitted infections).  If you do not wish to become pregnant, use a form of birth control. If you plan to become pregnant, see your health care provider for a prepregnancy visit.  If told by your health care provider, take low-dose aspirin daily starting at age 83.  Find healthy ways to cope with stress, such as: ? Meditation, yoga, or listening to music. ? Journaling. ? Talking to a trusted person. ? Spending time with friends and family. Safety  Always wear your seat belt while driving or riding in a vehicle.  Do not drive: ? If you have been drinking alcohol. Do not ride with someone who has been drinking. ? When you are tired or distracted. ? While texting.  Wear a helmet and other protective equipment during sports activities.  If you have firearms in your house, make sure you follow all gun safety procedures. What's next?  Visit your health care provider once a year for an annual wellness visit.  Ask your  health care provider how often you should have your eyes and teeth checked.  Stay up to date on all vaccines. This information is not intended to replace advice given to you by your health care provider. Make sure you discuss any questions you have with your health care provider. Document Revised: 09/23/2019 Document Reviewed: 08/30/2017 Elsevier Patient Education  2021 [employer].

## 2020-03-26 ENCOUNTER — Encounter: Payer: Self-pay | Admitting: Physician Assistant

## 2020-04-01 ENCOUNTER — Other Ambulatory Visit: Payer: Self-pay | Admitting: Physician Assistant

## 2020-04-01 DIAGNOSIS — R7989 Other specified abnormal findings of blood chemistry: Secondary | ICD-10-CM

## 2020-04-08 ENCOUNTER — Other Ambulatory Visit: Payer: Self-pay

## 2020-04-08 ENCOUNTER — Other Ambulatory Visit: Payer: 59

## 2020-04-08 DIAGNOSIS — R7989 Other specified abnormal findings of blood chemistry: Secondary | ICD-10-CM

## 2020-04-09 LAB — CBC
Hematocrit: 38.7 % (ref 34.0–46.6)
Hemoglobin: 12.7 g/dL (ref 11.1–15.9)
MCH: 27.7 pg (ref 26.6–33.0)
MCHC: 32.8 g/dL (ref 31.5–35.7)
MCV: 85 fL (ref 79–97)
Platelets: 227 10*3/uL (ref 150–450)
RBC: 4.58 x10E6/uL (ref 3.77–5.28)
RDW: 12.3 % (ref 11.7–15.4)
WBC: 5.7 10*3/uL (ref 3.4–10.8)

## 2020-10-20 ENCOUNTER — Ambulatory Visit (INDEPENDENT_AMBULATORY_CARE_PROVIDER_SITE_OTHER): Payer: 59

## 2020-10-20 ENCOUNTER — Other Ambulatory Visit: Payer: Self-pay

## 2020-10-20 VITALS — BP 136/83 | HR 83 | Temp 98.3°F | Wt 120.9 lb

## 2020-10-20 DIAGNOSIS — Z23 Encounter for immunization: Secondary | ICD-10-CM

## 2020-10-20 NOTE — Progress Notes (Signed)
Patient was  here for Influenza vaccination sot   Patient tolerated well

## 2020-12-01 NOTE — Addendum Note (Signed)
Addended by: Sylvester Harder on: 12/01/2020 02:29 PM   Modules accepted: Orders

## 2021-01-07 ENCOUNTER — Other Ambulatory Visit: Payer: Self-pay | Admitting: Physician Assistant

## 2021-01-07 DIAGNOSIS — Z1231 Encounter for screening mammogram for malignant neoplasm of breast: Secondary | ICD-10-CM

## 2021-02-08 IMAGING — MG DIGITAL SCREENING BILATERAL MAMMOGRAM WITH TOMO AND CAD
8 series · 8 of 24 positions shown · non-contrast
Comparison: Previous exam(s).

CLINICAL DATA: Screening.

EXAM:
DIGITAL SCREENING BILATERAL MAMMOGRAM WITH TOMO AND CAD

[L MLO synth-2D]
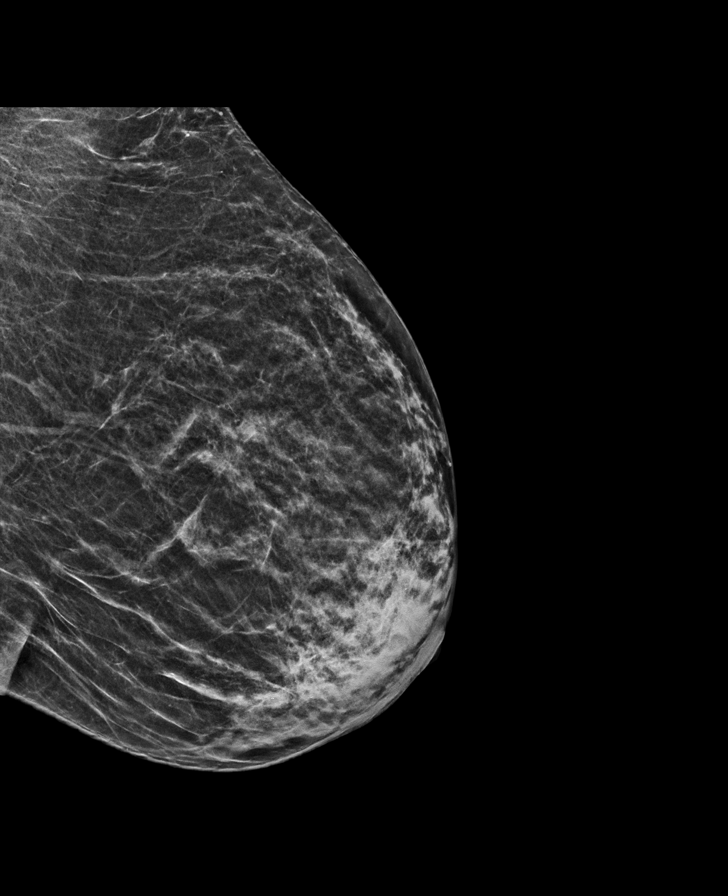

[R CC synth-2D]
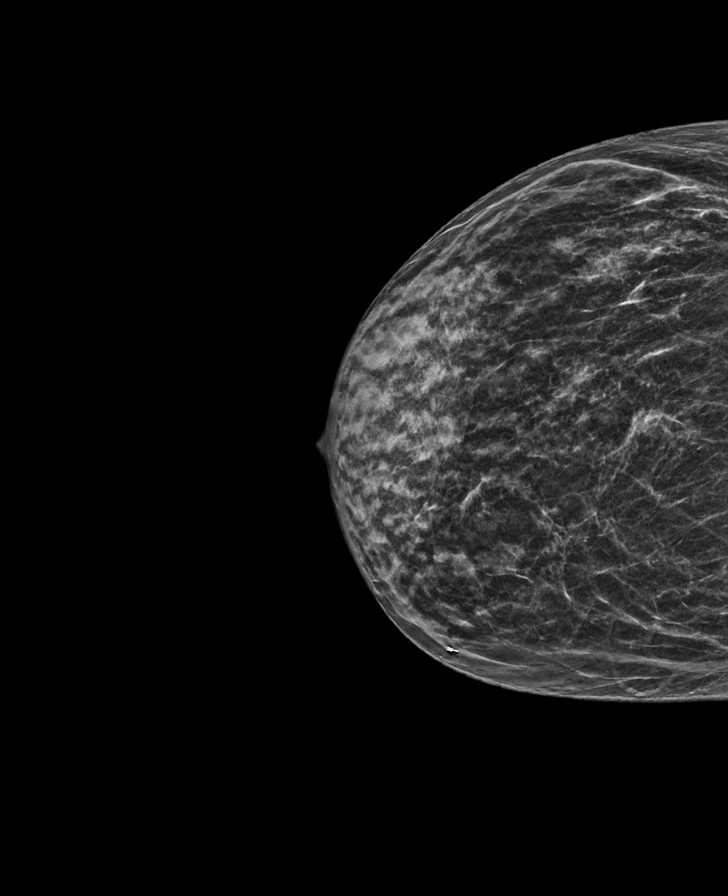

[R MLO synth-2D]
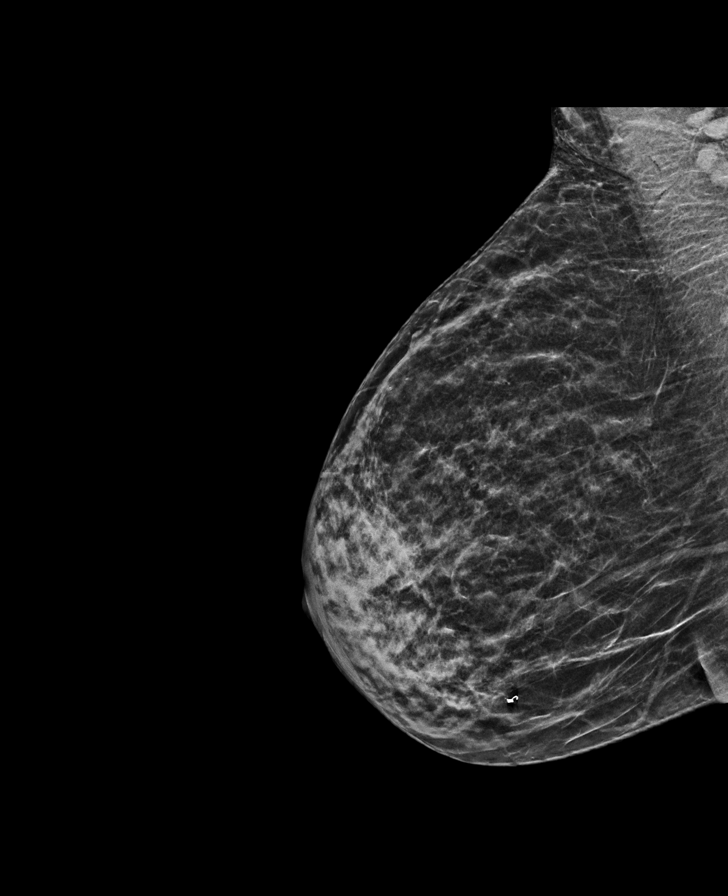

[L CC synth-2D]
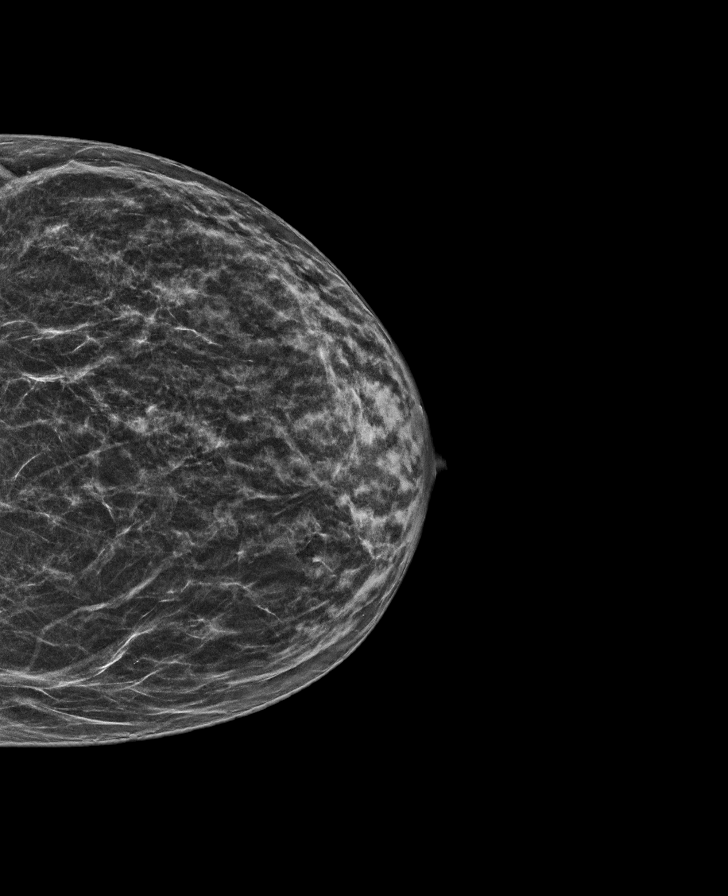

[L MLO tomo · tomo slice 25/49.0]
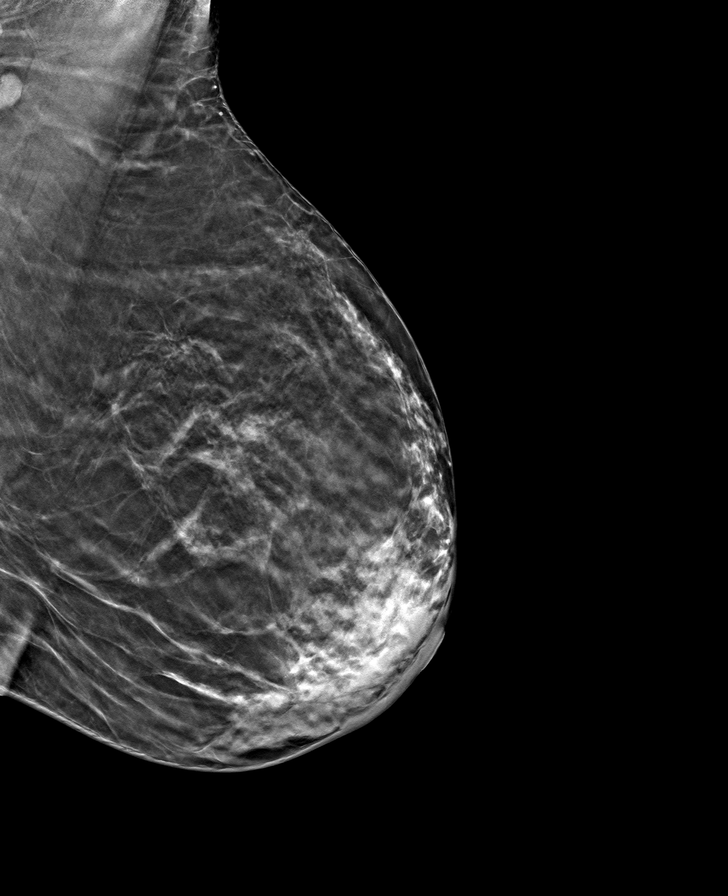

[R CC tomo · tomo slice 24/47.0]
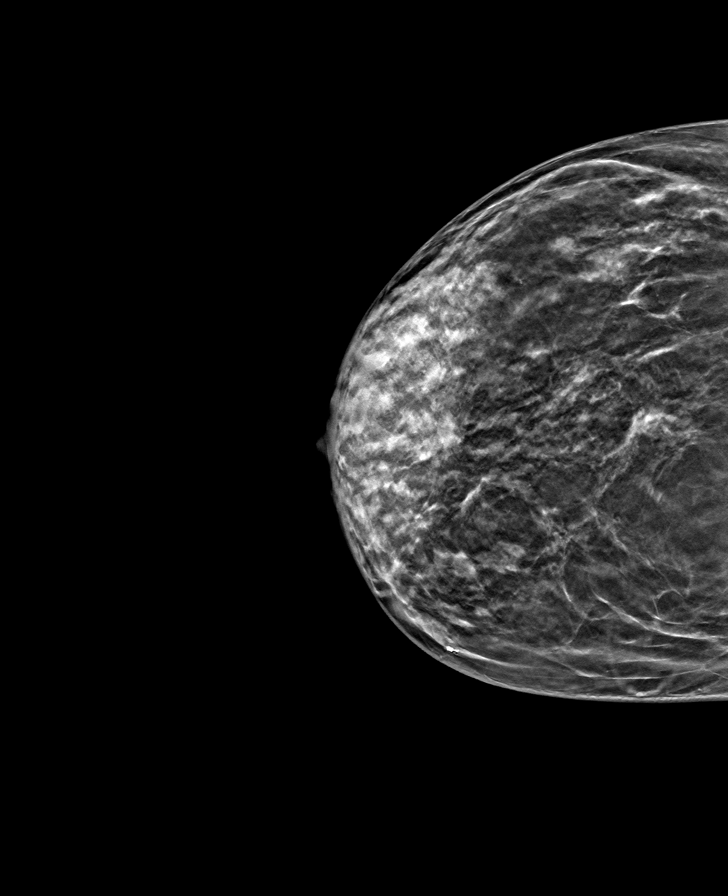

[L CC tomo · tomo slice 27/54.0]
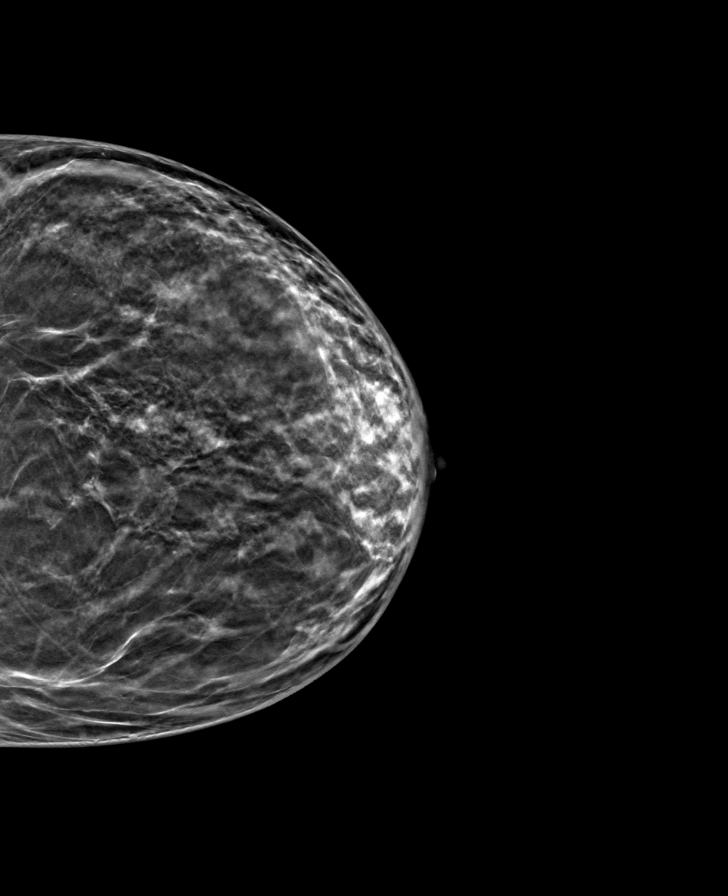

[R MLO tomo · tomo slice 25/48.0]
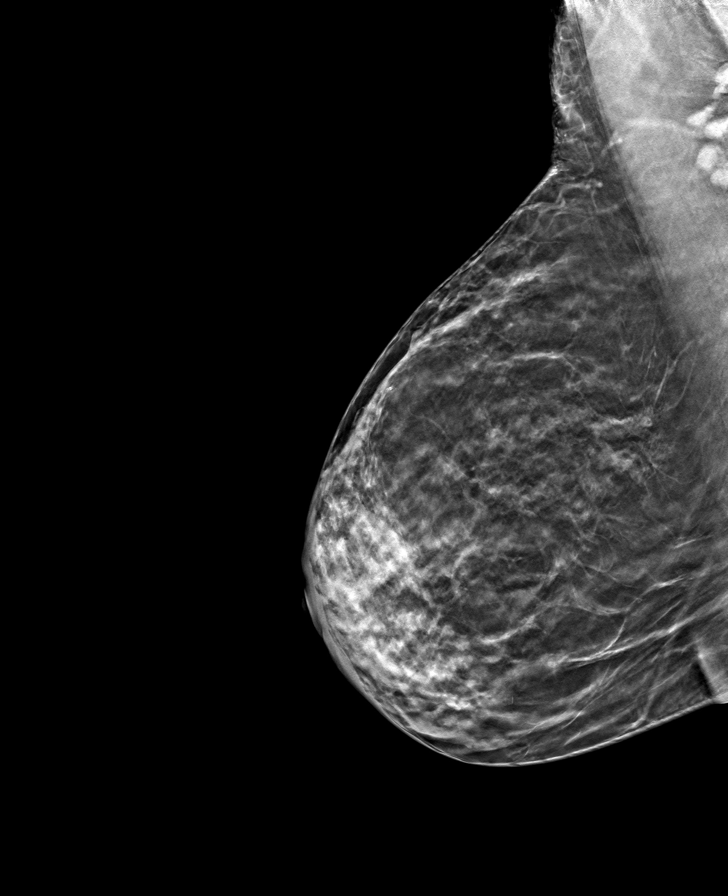

[8 of 24 positions shown; findings below may reference images not displayed]

ACR Breast Density Category c: The breast tissue is heterogeneously
dense, which may obscure small masses.
FINDINGS: In the right breast, possible distortion and possible asymmetry
warrant further evaluation. In the left breast, no findings
suspicious for malignancy. Images were processed with CAD.
IMPRESSION: Further evaluation is suggested for possible distortion and
asymmetry in the right breast.

RECOMMENDATION:
Diagnostic mammogram and possibly ultrasound of the right breast.
(Code:V6-X-EEE)

The patient will be contacted regarding the findings, and additional
imaging will be scheduled.

BI-RADS CATEGORY  0: Incomplete. Need additional imaging evaluation
and/or prior mammograms for comparison.

## 2021-02-21 ENCOUNTER — Other Ambulatory Visit: Payer: Self-pay

## 2021-02-21 ENCOUNTER — Ambulatory Visit
Admission: RE | Admit: 2021-02-21 | Discharge: 2021-02-21 | Disposition: A | Discharge status: Home / Self Care | Payer: 59 | Source: Ambulatory Visit

## 2021-02-21 DIAGNOSIS — Z1231 Encounter for screening mammogram for malignant neoplasm of breast: Secondary | ICD-10-CM

## 2021-03-17 ENCOUNTER — Other Ambulatory Visit: Payer: Self-pay

## 2021-03-17 DIAGNOSIS — Z0000 Encounter for general adult medical examination without abnormal findings: Secondary | ICD-10-CM

## 2021-03-17 DIAGNOSIS — E785 Hyperlipidemia, unspecified: Secondary | ICD-10-CM

## 2021-03-17 DIAGNOSIS — Z130 Encounter for screening for diseases of the blood and blood-forming organs and certain disorders involving the immune mechanism: Secondary | ICD-10-CM

## 2021-03-17 DIAGNOSIS — Z87898 Personal history of other specified conditions: Secondary | ICD-10-CM

## 2021-03-18 ENCOUNTER — Other Ambulatory Visit: Payer: Self-pay

## 2021-03-18 ENCOUNTER — Other Ambulatory Visit: Payer: 59

## 2021-03-19 LAB — TSH: TSH: 1.26 u[IU]/mL (ref 0.450–4.500)

## 2021-03-19 LAB — CBC WITH DIFFERENTIAL/PLATELET
Basophils Absolute: 0 10*3/uL (ref 0.0–0.2)
Basos: 1 %
EOS (ABSOLUTE): 0.2 10*3/uL (ref 0.0–0.4)
Eos: 3 %
Hematocrit: 41.3 % (ref 34.0–46.6)
Hemoglobin: 13.1 g/dL (ref 11.1–15.9)
Immature Grans (Abs): 0 10*3/uL (ref 0.0–0.1)
Immature Granulocytes: 0 %
Lymphocytes Absolute: 2.3 10*3/uL (ref 0.7–3.1)
Lymphs: 39 %
MCH: 27.1 pg (ref 26.6–33.0)
MCHC: 31.7 g/dL (ref 31.5–35.7)
MCV: 86 fL (ref 79–97)
Monocytes Absolute: 0.5 10*3/uL (ref 0.1–0.9)
Monocytes: 9 %
Neutrophils Absolute: 2.8 10*3/uL (ref 1.4–7.0)
Neutrophils: 48 %
Platelets: 257 10*3/uL (ref 150–450)
RBC: 4.83 x10E6/uL (ref 3.77–5.28)
RDW: 12.1 % (ref 11.7–15.4)
WBC: 5.8 10*3/uL (ref 3.4–10.8)

## 2021-03-19 LAB — COMPREHENSIVE METABOLIC PANEL
ALT: 13 IU/L (ref 0–32)
AST: 17 IU/L (ref 0–40)
Albumin/Globulin Ratio: 1.5 (ref 1.2–2.2)
Albumin: 4.6 g/dL (ref 3.8–4.8)
Alkaline Phosphatase: 108 IU/L (ref 44–121)
BUN/Creatinine Ratio: 14 (ref 12–28)
BUN: 12 mg/dL (ref 8–27)
Bilirubin Total: 0.4 mg/dL (ref 0.0–1.2)
CO2: 26 mmol/L (ref 20–29)
Calcium: 9.9 mg/dL (ref 8.7–10.3)
Chloride: 100 mmol/L (ref 96–106)
Creatinine, Ser: 0.87 mg/dL (ref 0.57–1.00)
Globulin, Total: 3 g/dL (ref 1.5–4.5)
Glucose: 86 mg/dL (ref 70–99)
Potassium: 5.5 mmol/L — ABNORMAL HIGH (ref 3.5–5.2)
Sodium: 140 mmol/L (ref 134–144)
Total Protein: 7.6 g/dL (ref 6.0–8.5)
eGFR: 76 mL/min/{1.73_m2} (ref >59)

## 2021-03-19 LAB — HEMOGLOBIN A1C
Est. average glucose Bld gHb Est-mCnc: 114 mg/dL
Hgb A1c MFr Bld: 5.6 % (ref 4.8–5.6)

## 2021-03-19 LAB — LIPID PANEL
Chol/HDL Ratio: 3.6 ratio (ref 0.0–4.4)
Cholesterol, Total: 160 mg/dL (ref 100–199)
HDL: 44 mg/dL (ref >39)
LDL Chol Calc (NIH): 105 mg/dL — ABNORMAL HIGH (ref 0–99)
Triglycerides: 56 mg/dL (ref 0–149)
VLDL Cholesterol Cal: 11 mg/dL (ref 5–40)

## 2021-03-21 ENCOUNTER — Encounter: Payer: Self-pay | Admitting: Physician Assistant

## 2021-03-21 ENCOUNTER — Other Ambulatory Visit: Payer: Self-pay

## 2021-03-21 ENCOUNTER — Ambulatory Visit (INDEPENDENT_AMBULATORY_CARE_PROVIDER_SITE_OTHER): Payer: 59 | Admitting: Physician Assistant

## 2021-03-21 ENCOUNTER — Other Ambulatory Visit (HOSPITAL_COMMUNITY)
Admission: RE | Admit: 2021-03-21 | Discharge: 2021-03-21 | Disposition: A | Discharge status: Home / Self Care | Payer: 59 | Source: Ambulatory Visit | Attending: Physician Assistant | Admitting: Physician Assistant

## 2021-03-21 VITALS — BP 138/92 | HR 92 | Temp 97.6°F | Ht 63.0 in | Wt 116.0 lb

## 2021-03-21 DIAGNOSIS — E785 Hyperlipidemia, unspecified: Secondary | ICD-10-CM

## 2021-03-21 DIAGNOSIS — Z0000 Encounter for general adult medical examination without abnormal findings: Secondary | ICD-10-CM

## 2021-03-21 DIAGNOSIS — Z124 Encounter for screening for malignant neoplasm of cervix: Secondary | ICD-10-CM | POA: Insufficient documentation

## 2021-03-21 DIAGNOSIS — R030 Elevated blood-pressure reading, without diagnosis of hypertension: Secondary | ICD-10-CM | POA: Diagnosis not present

## 2021-03-21 NOTE — Patient Instructions (Addendum)
Debrox ear drops ? ?Preventive Care 62-62 Years Old, Female ?Preventive care refers to lifestyle choices and visits with your health care provider that can promote health and wellness. Preventive care visits are also called wellness exams. ?What can I expect for my preventive care visit? ?Counseling ?Your health care provider may ask you questions about your: ?Medical history, including: ?Past medical problems. ?Family medical history. ?Pregnancy history. ?Current health, including: ?Menstrual cycle. ?Method of birth control. ?Emotional well-being. ?Home life and relationship well-being. ?Sexual activity and sexual health. ?Lifestyle, including: ?Alcohol, nicotine or tobacco, and drug use. ?Access to firearms. ?Diet, exercise, and sleep habits. ?Work and work Statistician. ?Sunscreen use. ?Safety issues such as seatbelt and bike helmet use. ?Physical exam ?Your health care provider will check your: ?Height and weight. These may be used to calculate your BMI (body mass index). BMI is a measurement that tells if you are at a healthy weight. ?Waist circumference. This measures the distance around your waistline. This measurement also tells if you are at a healthy weight and may help predict your risk of certain diseases, such as type 2 diabetes and high blood pressure. ?Heart rate and blood pressure. ?Body temperature. ?Skin for abnormal spots. ?What immunizations do I need? ?Vaccines are usually given at various ages, according to a schedule. Your health care provider will recommend vaccines for you based on your age, medical history, and lifestyle or other factors, such as travel or where you work. ?What tests do I need? ?Screening ?Your health care provider may recommend screening tests for certain conditions. This may include: ?Lipid and cholesterol levels. ?Diabetes screening. This is done by checking your blood sugar (glucose) after you have not eaten for a while (fasting). ?Pelvic exam and Pap test. ?Hepatitis B  test. ?Hepatitis C test. ?HIV (human immunodeficiency virus) test. ?STI (sexually transmitted infection) testing, if you are at risk. ?Lung cancer screening. ?Colorectal cancer screening. ?Mammogram. Talk with your health care provider about when you should start having regular mammograms. This may depend on whether you have a family history of breast cancer. ?BRCA-related cancer screening. This may be done if you have a family history of breast, ovarian, tubal, or peritoneal cancers. ?Bone density scan. This is done to screen for osteoporosis. ?Talk with your health care provider about your test results, treatment options, and if necessary, the need for more tests. ?Follow these instructions at home: ?Eating and drinking ? ?Eat a diet that includes fresh fruits and vegetables, whole grains, lean protein, and low-fat dairy products. ?Take vitamin and mineral supplements as recommended by your health care provider. ?Do not drink alcohol if: ?Your health care provider tells you not to drink. ?You are pregnant, may be pregnant, or are planning to become pregnant. ?If you drink alcohol: ?Limit how much you have to 0-1 drink a day. ?Know how much alcohol is in your drink. In the [place] one drink equals one 12 oz bottle of beer (355 mL), one 5 oz glass of wine (148 mL), or one 1? oz glass of hard liquor (44 mL). ?Lifestyle ?Brush your teeth every morning and night with fluoride toothpaste. Floss one time each day. ?Exercise for at least 30 minutes 5 or more days each week. ?Do not use any products that contain nicotine or tobacco. These products include cigarettes, chewing tobacco, and vaping devices, such as e-cigarettes. If you need help quitting, ask your health care provider. ?Do not use drugs. ?If you are sexually active, practice safe sex. Use a condom or other form  of protection to prevent STIs. ?If you do not wish to become pregnant, use a form of birth control. If you plan to become pregnant, see your health care  provider for a prepregnancy visit. ?Take aspirin only as told by your health care provider. Make sure that you understand how much to take and what form to take. Work with your health care provider to find out whether it is safe and beneficial for you to take aspirin daily. ?Find healthy ways to manage stress, such as: ?Meditation, yoga, or listening to music. ?Journaling. ?Talking to a trusted person. ?Spending time with friends and family. ?Minimize exposure to UV radiation to reduce your risk of skin cancer. ?Safety ?Always wear your seat belt while driving or riding in a vehicle. ?Do not drive: ?If you have been drinking alcohol. Do not ride with someone who has been drinking. ?When you are tired or distracted. ?While texting. ?If you have been using any mind-altering substances or drugs. ?Wear a helmet and other protective equipment during sports activities. ?If you have firearms in your house, make sure you follow all gun safety procedures. ?Seek help if you have been physically or sexually abused. ?What's next? ?Visit your health care provider once a year for an annual wellness visit. ?Ask your health care provider how often you should have your eyes and teeth checked. ?Stay up to date on all vaccines. ?This information is not intended to replace advice given to you by your health care provider. Make sure you discuss any questions you have with your health care provider. ?Document Revised: 06/16/2020 Document Reviewed: 06/16/2020 ?Elsevier Patient Education ? [place]. ? ?

## 2021-03-21 NOTE — Progress Notes (Signed)
? ?Complete physical exam ? ? ?Patient: Nicole Hawkins   DOB: [birth year]   61 y.o. Female  MRN: [id number] ?Visit Date: 03/21/2021 ? ? ?Chief Complaint  ?Patient presents with  ? Annual Exam  ? ?Subjective  ?  ?Nicole Hawkins is a 61 y.o. female who presents today for a complete physical exam.  ?She reports consuming a low fat diet. Home exercise routine includes walking 30 mins 3 times per week. She generally feels well. She does not have additional problems to discuss today.  ? ? ? ?History reviewed. No pertinent past medical history. ?Past Surgical History:  ?Procedure Laterality Date  ? BREAST BIOPSY Right   ? cyst  ? DILATION AND CURETTAGE OF UTERUS    ? ?Social History  ? ?Socioeconomic History  ? Marital status: Married  ?  Spouse name: Not on file  ? Number of children: Not on file  ? Years of education: Not on file  ? Highest education level: Not on file  ?Occupational History  ? Not on file  ?Tobacco Use  ? Smoking status: Never  ? Smokeless tobacco: Never  ?Vaping Use  ? Vaping Use: Never used  ?Substance and Sexual Activity  ? Alcohol use: Never  ? Drug use: Never  ? Sexual activity: Yes  ?  Partners: Male  ?  Birth control/protection: None  ?Other Topics Concern  ? Not on file  ?Social History Narrative  ? Not on file  ? ?Social Determinants of Health  ? ?Financial Resource Strain: Not on file  ?Food Insecurity: Not on file  ?Transportation Needs: Not on file  ?Physical Activity: Not on file  ?Stress: Not on file  ?Social Connections: Not on file  ?Intimate Partner Violence: Not on file  ? ? ? ?Medications: ?No outpatient medications prior to visit.  ? ?No facility-administered medications prior to visit.  ? ? ?Review of Systems ?Review of Systems:  ?A fourteen system review of systems was performed and found to be positive as per HPI. ? ? ?Last CBC ?Lab Results  ?Component Value Date  ? WBC 5.8 03/18/2021  ? HGB 13.1 03/18/2021  ? HCT 41.3 03/18/2021  ? MCV 86 03/18/2021  ? MCH 27.1 03/18/2021  ?  RDW 12.1 03/18/2021  ? PLT 257 03/18/2021  ? ?Last metabolic panel ?Lab Results  ?Component Value Date  ? GLUCOSE 86 03/18/2021  ? NA 140 03/18/2021  ? K 5.5 (H) 03/18/2021  ? CL 100 03/18/2021  ? CO2 26 03/18/2021  ? BUN 12 03/18/2021  ? CREATININE 0.87 03/18/2021  ? EGFR 76 03/18/2021  ? CALCIUM 9.9 03/18/2021  ? PROT 7.6 03/18/2021  ? ALBUMIN 4.6 03/18/2021  ? LABGLOB 3.0 03/18/2021  ? AGRATIO 1.5 03/18/2021  ? BILITOT 0.4 03/18/2021  ? ALKPHOS 108 03/18/2021  ? AST 17 03/18/2021  ? ALT 13 03/18/2021  ? ANIONGAP 14 01/27/2016  ? ?Last lipids ?Lab Results  ?Component Value Date  ? CHOL 160 03/18/2021  ? HDL 44 03/18/2021  ? LDLCALC 105 (H) 03/18/2021  ? TRIG 56 03/18/2021  ? CHOLHDL 3.6 03/18/2021  ? ?Last hemoglobin A1c ?Lab Results  ?Component Value Date  ? HGBA1C 5.6 03/18/2021  ? ?Last thyroid functions ?Lab Results  ?Component Value Date  ? TSH 1.260 03/18/2021  ? T3TOTAL 99 12/31/2018  ? ?Last vitamin D ?Lab Results  ?Component Value Date  ? VD25OH 77.1 01/21/2020  ? ?  ? Objective  ?  ?BP (!) 174/83   Pulse 92  Temp 97.6 ?F (36.4 ?C)   Ht _0  (1.6 m)   Wt 116 lb (52.6 kg)   SpO2 98%   BMI 20.55 kg/m?  ?BP Readings from Last 3 Encounters:  ?03/21/21 (!) 174/83  ?10/20/20 136/83  ?03/11/20 131/76  ? ?Wt Readings from Last 3 Encounters:  ?03/21/21 116 lb (52.6 kg)  ?10/20/20 120 lb 14.4 oz (54.8 kg)  ?03/11/20 118 lb 14.4 oz (53.9 kg)  ? ?  ? ?Physical Exam  ? ?General Appearance:    Alert, cooperative, in no acute distress, appears stated age   ?Head:    Normocephalic, without obvious abnormality, atraumatic  ?Eyes:    PERRL, conjunctiva/corneas clear, EOM's intact, fundi  ?  benign, both eyes  ?Ears:    Normal TM's and external ear canals, left ear; cerumen impaction of right ear  ?Nose:   Nares normal, septum midline, mucosa normal, no drainage  ?  or sinus tenderness  ?Throat:   Lips, mucosa, and tongue normal; teeth and gums normal  ?Neck:   Supple, symmetrical, trachea midline, no adenopathy;  ?   thyroid:  no enlargement/tenderness/nodules; no JVD  ?Back:     Symmetric, no curvature, ROM normal, no CVA tenderness  ?Lungs:     Clear to auscultation bilaterally, respirations unlabored  ?Chest Wall:    No tenderness or deformity  ? Heart:    Normal heart rate. Normal rhythm. No murmurs, rubs, or gallops.  ?  ?Breast Exam:    Normal to palpation without dominant masses, deferred  ?Abdomen:     Soft, non-tender, bowel sounds active all four quadrants,  ?  no masses, no organomegaly  ?Pelvic:    cervix normal in appearance, external genitalia normal, no cervical motion tenderness, uterus normal size, shape, and consistency, and vagina normal without discharge  ?Extremities:   All extremities are intact. No cyanosis or edema  ?Pulses:   2+ and symmetric all extremities  ?Skin:   Skin color, texture, turgor normal, no rashes or lesions  ?Lymph nodes:   Cervical, supraclavicular, and axillary nodes normal  ?Neurologic:   CNII-XII grossly intact  ? ? ? ?Last depression screening scores ?PHQ 2/9 Scores 03/21/2021 03/11/2020 02/12/2020  ?PHQ - 2 Score 0 0 0  ?PHQ- 9 Score 0 1 1  ? ?Last fall risk screening ?Fall Risk  03/21/2021  ?Falls in the past year? 0  ?Number falls in past yr: 0  ?Injury with Fall? 0  ?Risk for fall due to : No Fall Risks  ?Follow up Falls evaluation completed  ? ? ? ?No results found for any visits on 03/21/21. ? Assessment & Plan  ?  ?Routine Health Maintenance and Physical Exam ? ?Exercise Activities and Dietary recommendations ?-Discussed heart healthy diet low in fat and carbohydrates. ? ?Immunization History  ?Administered Date(s) Administered  ? Influenza,inj,Quad PF,6+ Mos 10/26/2017, 10/09/2018, 10/20/2020  ? Influenza,inj,quad, With Preservative 01/06/2016  ? Tdap 01/03/2007, 12/17/2017  ? ? ?Health Maintenance  ?Topic Date Due  ? COVID-19 Vaccine (1) Never done  ? Zoster Vaccines- Shingrix (1 of 2) Never done  ? PAP SMEAR-Modifier  12/17/2020  ? MAMMOGRAM  02/22/2023  ? COLONOSCOPY (Pts  45-45yr Insurance coverage will need to be confirmed)  01/22/2024  ? TETANUS/TDAP  12/18/2027  ? INFLUENZA VACCINE  Completed  ? Hepatitis C Screening  Completed  ? HIV Screening  Completed  ? HPV VACCINES  Aged Out  ? ? ?Discussed health benefits of physical activity, and encouraged her to engage in  regular exercise appropriate for her age and condition. ? ?Problem List Items Addressed This Visit   ? ?  ? Other  ? Hyperlipidemia  ? ?Other Visit Diagnoses   ? ? Healthcare maintenance    -  Primary  ? Screening for cervical cancer      ? Relevant Orders  ? Cytology - PAP( [facility])  ? Elevated blood-pressure reading without diagnosis of hypertension      ? ?  ? ?Pap collected. ?Discussed with patient most recent lab results which are essentially within normal limits or stable from prior. LDL has improved. Recommend to continue diet changes. ?Declined Shingrix. Prefers to check with insurance cost.  ?UTD mammogram and colonoscopy. ?Recommend to use Debrox for cerumen impaction. Return the clinic for ear lavage if unsuccessful. ?BP elevated, BP repeated with mild improvement. Pt will schedule nurse visit in 2 weeks for BP repeat. ? ? ?Return in about 1 year (around 03/22/2022) for CPE and FBW include Vit D few days prior .  ?  ? ? ? ?Lorrene Reid, PA-C  ?[facility] ?[phone] (phone) ?[phone] (fax) ? ?[facility] ?

## 2021-03-22 LAB — CYTOLOGY - PAP
Comment: NEGATIVE
Diagnosis: NEGATIVE
High risk HPV: NEGATIVE

## 2021-04-04 ENCOUNTER — Other Ambulatory Visit (INDEPENDENT_AMBULATORY_CARE_PROVIDER_SITE_OTHER): Payer: 59 | Admitting: Physician Assistant

## 2021-04-04 VITALS — BP 156/86 | Ht 63.0 in | Wt 115.0 lb

## 2021-04-04 DIAGNOSIS — R030 Elevated blood-pressure reading, without diagnosis of hypertension: Secondary | ICD-10-CM | POA: Diagnosis not present

## 2021-04-04 NOTE — Progress Notes (Signed)
HPI - Patient came in to have a BP recheck with the nurse. She is currently not on a BP pressure med.  ? ?Assessment and Plan - Each blood pressure reading was more elevated then at CPE appointment and her blood pressures at home. She does not check her BP at home regularly but when she does she states that they are in the 120s/80s. After speaking with her PCP, the patient was advised to keep track of her BP at home for 2 weeks in the mornings and evenings. Patient will bring in the log once she has completed the two weeks.  ? ?Agree with above documentation. Per chart review, patient's previous blood pressure readings in the office fluctuate between normal and stage 1-2 hypertension. Will reassess after having ambulatory BP readings. ?

## 2022-01-11 ENCOUNTER — Other Ambulatory Visit: Payer: Self-pay | Admitting: Family Medicine

## 2022-01-11 ENCOUNTER — Other Ambulatory Visit: Payer: Self-pay | Admitting: Physician Assistant

## 2022-01-11 DIAGNOSIS — Z1231 Encounter for screening mammogram for malignant neoplasm of breast: Secondary | ICD-10-CM

## 2022-03-02 ENCOUNTER — Ambulatory Visit
Admission: RE | Admit: 2022-03-02 | Discharge: 2022-03-02 | Disposition: A | Discharge status: Home / Self Care | Payer: BC Managed Care – PPO | Source: Ambulatory Visit

## 2022-03-02 ENCOUNTER — Other Ambulatory Visit: Payer: Self-pay | Admitting: Family Medicine

## 2022-03-02 DIAGNOSIS — Z1231 Encounter for screening mammogram for malignant neoplasm of breast: Secondary | ICD-10-CM

## 2022-03-23 ENCOUNTER — Encounter: Payer: 59 | Admitting: Family Medicine

## 2023-02-03 ENCOUNTER — Other Ambulatory Visit: Payer: Self-pay | Admitting: Family Medicine

## 2023-02-03 DIAGNOSIS — Z1231 Encounter for screening mammogram for malignant neoplasm of breast: Secondary | ICD-10-CM

## 2023-02-22 LAB — LAB REPORT - SCANNED: A1c: 5.7

## 2023-03-05 ENCOUNTER — Ambulatory Visit
Admission: RE | Admit: 2023-03-05 | Discharge: 2023-03-05 | Disposition: A | Discharge status: Home / Self Care | Payer: BC Managed Care – PPO | Source: Ambulatory Visit

## 2023-03-05 DIAGNOSIS — Z1231 Encounter for screening mammogram for malignant neoplasm of breast: Secondary | ICD-10-CM

## 2023-04-12 ENCOUNTER — Encounter: Admitting: Family Medicine

## 2023-10-03 ENCOUNTER — Other Ambulatory Visit: Payer: Self-pay | Admitting: Medical Genetics

## 2023-11-01 ENCOUNTER — Other Ambulatory Visit

## 2024-01-07 ENCOUNTER — Other Ambulatory Visit: Payer: Self-pay | Admitting: Medical Genetics

## 2024-01-07 DIAGNOSIS — Z006 Encounter for examination for normal comparison and control in clinical research program: Secondary | ICD-10-CM

## 2024-01-21 LAB — GENECONNECT MOLECULAR SCREEN: Genetic Analysis Overall Interpretation: NEGATIVE

## 2024-02-08 ENCOUNTER — Other Ambulatory Visit: Payer: Self-pay | Admitting: Family Medicine

## 2024-02-08 DIAGNOSIS — Z1231 Encounter for screening mammogram for malignant neoplasm of breast: Secondary | ICD-10-CM

## 2024-02-28 ENCOUNTER — Encounter: Admitting: Family Medicine

## 2024-03-07 ENCOUNTER — Ambulatory Visit

## 2024-03-07 DIAGNOSIS — Z1231 Encounter for screening mammogram for malignant neoplasm of breast: Secondary | ICD-10-CM
